# Patient Record
Sex: Male | Born: 1950 | Race: Black or African American | Hispanic: No | Marital: Single
Health system: Midwestern US, Community
[De-identification: ages and names within clinical notes are randomized; demographics above are authoritative.]

## PROBLEM LIST (undated history)

## (undated) DIAGNOSIS — D126 Benign neoplasm of colon, unspecified: Secondary | ICD-10-CM

## (undated) DIAGNOSIS — J90 Pleural effusion, not elsewhere classified: Secondary | ICD-10-CM

## (undated) DIAGNOSIS — H409 Unspecified glaucoma: Secondary | ICD-10-CM

## (undated) HISTORY — DX: Benign neoplasm of colon, unspecified: D12.6

## (undated) HISTORY — DX: Unspecified glaucoma: H40.9

## (undated) HISTORY — PX: CERVICAL FUSION: SHX112

---

## 2007-05-27 ENCOUNTER — Ambulatory Visit: Payer: Self-pay | Admitting: Family Medicine

## 2007-05-27 ENCOUNTER — Ambulatory Visit (HOSPITAL_COMMUNITY): Admission: RE | Admit: 2007-05-27 | Discharge: 2007-05-27 | Payer: Self-pay | Admitting: Family Medicine

## 2007-05-28 ENCOUNTER — Emergency Department (HOSPITAL_COMMUNITY): Admission: EM | Admit: 2007-05-28 | Discharge: 2007-05-28 | Payer: Self-pay | Admitting: Emergency Medicine

## 2007-06-01 ENCOUNTER — Inpatient Hospital Stay (HOSPITAL_COMMUNITY): Admission: RE | Admit: 2007-06-01 | Discharge: 2007-06-03 | Payer: Self-pay | Admitting: Neurosurgery

## 2007-09-15 HISTORY — PX: CERVICAL FUSION: SHX112

## 2007-10-18 ENCOUNTER — Encounter: Admission: RE | Admit: 2007-10-18 | Discharge: 2007-11-23 | Payer: Self-pay | Admitting: Neurosurgery

## 2008-01-06 ENCOUNTER — Encounter: Admission: RE | Admit: 2008-01-06 | Discharge: 2008-02-03 | Payer: Self-pay | Admitting: Neurosurgery

## 2008-03-14 ENCOUNTER — Encounter: Admission: RE | Admit: 2008-03-14 | Discharge: 2008-06-12 | Payer: Self-pay | Admitting: Neurosurgery

## 2008-04-24 ENCOUNTER — Ambulatory Visit: Payer: Self-pay | Admitting: Family Medicine

## 2008-05-03 ENCOUNTER — Ambulatory Visit: Payer: Self-pay | Admitting: Gastroenterology

## 2008-05-15 DIAGNOSIS — D126 Benign neoplasm of colon, unspecified: Secondary | ICD-10-CM

## 2008-05-15 HISTORY — DX: Benign neoplasm of colon, unspecified: D12.6

## 2008-05-17 ENCOUNTER — Ambulatory Visit: Payer: Self-pay | Admitting: Gastroenterology

## 2008-05-17 ENCOUNTER — Encounter: Payer: Self-pay | Admitting: Gastroenterology

## 2008-05-18 ENCOUNTER — Encounter: Payer: Self-pay | Admitting: Gastroenterology

## 2008-06-12 ENCOUNTER — Encounter: Admission: RE | Admit: 2008-06-12 | Discharge: 2008-06-12 | Payer: Self-pay | Admitting: Otolaryngology

## 2011-01-27 NOTE — Op Note (Signed)
NAME:  Phillip Ryan, Phillip Ryan NO.:  [id number]   MEDICAL RECORD NO.:  [id number]          PATIENT TYPE:  INP   LOCATION:  3031                         FACILITY:  [facility]   PHYSICIAN:  Hilda Lias, M.D.   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   PROCEDURE:  Anterior C3-4, 4-5, 5-6 decompression of spinal cord,  diskectomy, interbody fusion with allograft, plate, microscope.   SURGEON:  Hilda Lias, M.D.   ASSISTANT:  Coletta Memos, M.D.   CLINICAL HISTORY:  The patient was seen in my office few days ago  because sudden onset of weakness of the deltoid, biceps and wrist  extensors.  He had normal triceps.  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  Surgery was advised.  The  risks were explained to him and his wife.   PROCEDURE:  The patient was taken to the OR and after intubation, the  neck was cleaned with DuraPrep.  Transverse incision was then the left  side through the skin, subcutaneous tissue.  We went straight to the  cervical spine and x-rays showed that indeed we were at the level 4-5.  From then on, we removed the anterior osteophyte of 3-4, 4-5, 5-6.  We  brought the microscope into the area and we started at the level C3-C4  where the patient has step off of 2-3 mm.  The disk was quite  degenerative.  Diskectomy was accomplished.  The posterior ligament was  opened and decompression of both C4 nerve roots was achieved.  At the  level 4-5, we had the same finding with quite a bit of stenosis and  degenerative disk.  At the level 5-6 we found that indeed  there was two  pieces of herniated disk were completely adherent to the C6 nerve root.  Using the microdissection, we were able to decompress the C6 nerve root  with plenty of space.  At the end we drilled the endplates and three  piece of allograft of 7 mm were inserted followed by a plate using eight  screws.  Lateral cervical spine taken twice showed good position of the  plate and the screws.  From then on the area was irrigated.  Although we  accomplished good hemostasis nevertheless drain was left in the cervical  area.  The wound was closed with Vicryl and Steri-Strips.           ______________________________  Hilda Lias, M.D.     EB/MEDQ  D:  06/01/2007  T:  06/02/2007  Job:  [id number]

## 2011-06-25 LAB — CBC
RBC: 5.09
WBC: 6.5

## 2011-08-09 ENCOUNTER — Encounter: Payer: Self-pay | Admitting: *Deleted

## 2011-08-09 ENCOUNTER — Emergency Department (HOSPITAL_COMMUNITY): Payer: Medicare Other

## 2011-08-09 ENCOUNTER — Emergency Department (HOSPITAL_COMMUNITY)
Admission: EM | Admit: 2011-08-09 | Discharge: 2011-08-09 | Disposition: A | Discharge status: Home / Self Care | Payer: Medicare Other | Attending: Emergency Medicine | Admitting: Emergency Medicine

## 2011-08-09 DIAGNOSIS — Z981 Arthrodesis status: Secondary | ICD-10-CM | POA: Insufficient documentation

## 2011-08-09 DIAGNOSIS — S0181XA Laceration without foreign body of other part of head, initial encounter: Secondary | ICD-10-CM

## 2011-08-09 DIAGNOSIS — W1809XA Striking against other object with subsequent fall, initial encounter: Secondary | ICD-10-CM | POA: Insufficient documentation

## 2011-08-09 DIAGNOSIS — S0120XA Unspecified open wound of nose, initial encounter: Secondary | ICD-10-CM | POA: Insufficient documentation

## 2011-08-09 DIAGNOSIS — S0180XA Unspecified open wound of other part of head, initial encounter: Secondary | ICD-10-CM | POA: Insufficient documentation

## 2011-08-09 DIAGNOSIS — S060X9A Concussion with loss of consciousness of unspecified duration, initial encounter: Secondary | ICD-10-CM | POA: Insufficient documentation

## 2011-08-09 DIAGNOSIS — S069X9A Unspecified intracranial injury with loss of consciousness of unspecified duration, initial encounter: Secondary | ICD-10-CM

## 2011-08-09 DIAGNOSIS — M25569 Pain in unspecified knee: Secondary | ICD-10-CM | POA: Insufficient documentation

## 2011-08-09 DIAGNOSIS — M542 Cervicalgia: Secondary | ICD-10-CM | POA: Insufficient documentation

## 2011-08-09 DIAGNOSIS — W19XXXA Unspecified fall, initial encounter: Secondary | ICD-10-CM

## 2011-08-09 HISTORY — DX: Pleural effusion, not elsewhere classified: J90

## 2011-08-09 MED ORDER — DIAZEPAM 5 MG PO TABS: 5.0000 mg | ORAL_TABLET | Freq: Three times a day (TID) | ORAL | Status: DC | PRN

## 2011-08-09 MED ORDER — OXYCODONE-ACETAMINOPHEN 5-325 MG PO TABS
1.0000 | ORAL_TABLET | Freq: Once | ORAL | Status: AC
  Administered 2011-08-09: 1 via ORAL
  Filled 2011-08-09: qty 1

## 2011-08-09 MED ORDER — TETANUS-DIPHTHERIA TOXOIDS TD 5-2 LFU IM INJ
0.5000 mL | INJECTION | Freq: Once | INTRAMUSCULAR | Status: DC
  Filled 2011-08-09: qty 0.5

## 2011-08-09 MED ORDER — TETANUS-DIPHTH-ACELL PERTUSSIS 5-2.5-18.5 LF-MCG/0.5 IM SUSP
0.5000 mL | Freq: Once | INTRAMUSCULAR | Status: AC
  Administered 2011-08-09: 0.5 mL via INTRAMUSCULAR

## 2011-08-09 MED ORDER — OXYCODONE-ACETAMINOPHEN 5-325 MG PO TABS: 2.0000 | ORAL_TABLET | ORAL | Status: DC | PRN

## 2011-08-09 MED ORDER — IBUPROFEN 800 MG PO TABS
800.0000 mg | ORAL_TABLET | Freq: Once | ORAL | Status: AC
  Administered 2011-08-09: 800 mg via ORAL
  Filled 2011-08-09: qty 1

## 2011-08-09 MED ORDER — IBUPROFEN 800 MG PO TABS: 800.0000 mg | ORAL_TABLET | Freq: Three times a day (TID) | ORAL | Status: AC

## 2011-08-09 NOTE — ED Notes (Signed)
Pt states he lost consciousness while walking this evening.  C/o pain to left face and neck.  Glasses broke during fall.  Laceration to left cheek and forehead.  Bleeding controlled.

## 2011-08-09 NOTE — ED Provider Notes (Addendum)
History     CSN: [id number] Arrival date & time: 08/09/2011  2:33 AM   First MD Initiated Contact with Patient 08/09/11 0239      Chief Complaint  Patient presents with  . Loss of Consciousness    (Consider location/radiation/quality/duration/timing/severity/associated sxs/prior treatment) HPI 60 year old male presents to emergency department from home after fall. Patient reports he was walking down the hallway when his right knee gave out causing him to fall and hit his head. Patient reports he was briefly knocked unconscious for 10-15 seconds after the fall. Patient denies syncope and then fall. Patient reports he twisted his knee about a week ago and since then he has had problems with the knee. Patient complaining of posterior neck pain, and laceration to the face. Patient is unsure when his last tetanus booster was. Patient reports he has a history of chronic neck pain with prior cervical fusion. Patient reports current neck pain is similar to prior neck pain, just aggravated by the fall. Patient is back to baseline per family. Patient reports he had 2 beers and 2 mixed drinks tonight which is slightly more than he normally drinks.  Past Medical History  Diagnosis Date  . Pleural effusion     Past Surgical History  Procedure Date  . Cervical fusion     History reviewed. No pertinent family history.  History  Substance Use Topics  . Smoking status: Former Games developer  . Smokeless tobacco: Not on file  . Alcohol Use: Yes      Review of Systems  All other systems reviewed and are negative.    Allergies  Review of patient's allergies indicates no known allergies.  Home Medications  No current outpatient prescriptions on file.  BP 112/70  Pulse 90  Temp(Src) 97.8 F (36.6 C) (Oral)  Resp 16  SpO2 100%  Physical Exam  Nursing note and vitals reviewed. Constitutional: He is oriented to person, place, and time. He appears well-developed and well-nourished. No  distress.  HENT:  Head: Normocephalic.  Right Ear: External ear normal.  Left Ear: External ear normal.  Nose: Nose normal.  Mouth/Throat: Oropharynx is clear and moist.       Laceration to 4 head and left face corner of nose just above the lip  Eyes: Conjunctivae and EOM are normal. Pupils are equal, round, and reactive to light.  Neck: Normal range of motion. Neck supple. No JVD present. No tracheal deviation present. No thyromegaly present.       No step-off no crepitus mild diffuse tenderness to palpation of posterior neck  Cardiovascular: Normal rate, regular rhythm, normal heart sounds and intact distal pulses.  Exam reveals no gallop and no friction rub.   No murmur heard. Pulmonary/Chest: Effort normal and breath sounds normal. No stridor. No respiratory distress. He has no wheezes. He has no rales. He exhibits no tenderness.  Abdominal: Soft. Bowel sounds are normal. He exhibits no distension and no mass. There is no tenderness. There is no rebound and no guarding.  Musculoskeletal: Normal range of motion. He exhibits tenderness (mild diffuse tenderness over right knee mainly over patella. No joint line tenderness normal flexion extension, normal anterior posterior drawer testing). He exhibits no edema.       Healing abrasions over the right patella  Lymphadenopathy:    He has no cervical adenopathy.  Neurological: He is oriented to person, place, and time. He has normal reflexes. No cranial nerve deficit. He exhibits normal muscle tone. Coordination normal.  Skin: Skin is dry.  No rash noted. No erythema. No pallor.  Psychiatric: He has a normal mood and affect. His behavior is normal. Judgment and thought content normal.    ED Course  Procedures (including critical care time)  LACERATION REPAIR Performed by: Olivia Mackie Authorized by: Olivia Mackie Consent: Verbal consent obtained. Risks and benefits: risks, benefits and alternatives were discussed Consent given by:  patient Patient identity confirmed: provided demographic data Prepped and Draped in normal sterile fashion Wound explored  Laceration Location: left nasolabial fold  Laceration Length: 4 cm  No Foreign Bodies seen or palpated  Anesthesia: local infiltration  Local anesthetic: lidocaine 2% with epinephrine  Anesthetic total: 6 ml  Irrigation method: syringe Amount of cleaning: extensive  Skin closure:  Moderate complexity  Number of sutures: 2 deep vertical mattress of 6.0 vicryl, 9 interrupted of 5.0 prolene  Technique: vertical mattress, simple interrupted  Patient tolerance: Patient tolerated the procedure well with no immediate complications.  LACERATION REPAIR Performed by: Olivia Mackie Authorized by: Olivia Mackie Consent: Verbal consent obtained. Risks and benefits: risks, benefits and alternatives were discussed Consent given by: patient Patient identity confirmed: provided demographic data Prepped and Draped in normal sterile fashion Wound explored  Laceration Location: forehead center  Laceration Length: 1 cm  No Foreign Bodies seen or palpated  Anesthesia: none  Irrigation method: syringe Amount of cleaning: standard  Skin closure: steristrips  Number of sutures: 3    Patient tolerance: Patient tolerated the procedure well with no immediate complications.  Labs Reviewed - No data to display Dg Knee 2 Views Right  08/09/2011  *RADIOLOGY REPORT*  Clinical Data: Status post fall 1 week ago; right patellar pain. Knee gives out.  RIGHT KNEE - 1-2 VIEW  Comparison: None.  Findings: There is no evidence of fracture or dislocation.  The joint spaces are preserved.  No significant degenerative change is seen; the patellofemoral joint is grossly unremarkable in appearance.  No significant joint effusion is seen.  Prepatellar soft tissue swelling is noted.  IMPRESSION:  1.  No evidence of fracture or dislocation.  Given the history, MRI could be considered  for further evaluation, though there is no evidence of a significant joint effusion to suggest internal derangement. 2.  Prepatellar soft tissue swelling noted.  Original Report Authenticated By: Tonia Ghent, M.D.   Ct Head Wo Contrast  08/09/2011  *RADIOLOGY REPORT*  Clinical Data:  Loss of consciousness while walking; status post fall, with pain at the left face and neck.  Lacerations to the left cheek and forehead.  CT HEAD WITHOUT CONTRAST CT MAXILLOFACIAL WITHOUT CONTRAST CT CERVICAL SPINE WITHOUT CONTRAST  Technique:  Multidetector CT imaging of the head, cervical spine, and maxillofacial structures were performed using the standard protocol without intravenous contrast. Multiplanar CT image reconstructions of the cervical spine and maxillofacial structures were also generated.  Comparison:  MRI of the cervical spine performed 05/28/2007, and cervical spine radiographs performed 06/01/2007  CT HEAD  Findings: There is no evidence of acute infarction, mass lesion, or intra- or extra-axial hemorrhage on CT.  The posterior fossa, including the cerebellum, brainstem and fourth ventricle, is within normal limits.  The third and lateral ventricles, and basal ganglia are unremarkable in appearance.  The cerebral hemispheres are symmetric in appearance, with normal gray- white differentiation.  No mass effect or midline shift is seen.  There is no evidence of fracture; visualized osseous structures are unremarkable in appearance.  The visualized portions of the orbits are within normal limits.  The paranasal  sinuses and mastoid air cells are well-aerated.  A soft tissue laceration is noted overlying the left frontal calvarium, with scattered soft tissue air.  IMPRESSION:  1.  No evidence of traumatic intracranial injury or fracture. 2.  Soft tissue laceration overlying the left frontal calvarium, with scattered soft tissue air.  CT MAXILLOFACIAL  Findings:  There is no evidence of fracture or dislocation.  The  maxilla and mandible appear intact.  The nasal bone is unremarkable in appearance.  The visualized dentition demonstrates no acute abnormality.  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  The orbits are intact bilaterally.  There is partial opacification of the maxillary sinuses, more prominent on the right.  The remaining paranasal sinuses and mastoid air cells are well-aerated.  A relatively deep soft tissue laceration is noted overlying the left maxilla, with underlying soft tissue air.  There is also a soft tissue laceration overlying the left frontal calvarium, with soft tissue air.  The parapharyngeal fat planes are preserved.  The nasopharynx, oropharynx and hypopharynx are unremarkable in appearance.  The visualized portions of the valleculae and piriform sinuses are grossly unremarkable.  The parotid and submandibular glands are within normal limits.  No cervical lymphadenopathy is seen.  IMPRESSION:  1.  No evidence of fracture or dislocation. 2.  Relatively deep soft tissue laceration overlying the left maxilla, with underlying soft tissue air. 3.  Soft tissue laceration overlying the left frontal calvarium, with soft tissue air. 4.  Partial opacification of the maxillary sinuses, more prominent on the right. 5.  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  CT CERVICAL SPINE  Findings:   There is no evidence of fracture or subluxation. Vertebral bodies demonstrate normal height and alignment. The patient is status post anterior cervical spinal fusion at C3-C6. Mild degenerative change is noted about the dens.  Prevertebral soft tissues are within normal limits.  The thyroid gland is unremarkable in appearance.  Blebs are noted at the right lung apex.  No significant soft tissue abnormalities are seen.  IMPRESSION:  1.  No evidence of fracture or subluxation along the cervical spine. 2.  Status post anterior cervical spinal fusion at C3-C6. 3.  Blebs noted at the  right lung apex.  Original Report Authenticated By: Tonia Ghent, M.D.   Ct Cervical Spine Wo Contrast  08/09/2011  *RADIOLOGY REPORT*  Clinical Data:  Loss of consciousness while walking; status post fall, with pain at the left face and neck.  Lacerations to the left cheek and forehead.  CT HEAD WITHOUT CONTRAST CT MAXILLOFACIAL WITHOUT CONTRAST CT CERVICAL SPINE WITHOUT CONTRAST  Technique:  Multidetector CT imaging of the head, cervical spine, and maxillofacial structures were performed using the standard protocol without intravenous contrast. Multiplanar CT image reconstructions of the cervical spine and maxillofacial structures were also generated.  Comparison:  MRI of the cervical spine performed 05/28/2007, and cervical spine radiographs performed 06/01/2007  CT HEAD  Findings: There is no evidence of acute infarction, mass lesion, or intra- or extra-axial hemorrhage on CT.  The posterior fossa, including the cerebellum, brainstem and fourth ventricle, is within normal limits.  The third and lateral ventricles, and basal ganglia are unremarkable in appearance.  The cerebral hemispheres are symmetric in appearance, with normal gray- white differentiation.  No mass effect or midline shift is seen.  There is no evidence of fracture; visualized osseous structures are unremarkable in appearance.  The visualized portions of the orbits are within normal limits.  The  paranasal sinuses and mastoid air cells are well-aerated.  A soft tissue laceration is noted overlying the left frontal calvarium, with scattered soft tissue air.  IMPRESSION:  1.  No evidence of traumatic intracranial injury or fracture. 2.  Soft tissue laceration overlying the left frontal calvarium, with scattered soft tissue air.  CT MAXILLOFACIAL  Findings:  There is no evidence of fracture or dislocation.  The maxilla and mandible appear intact.  The nasal bone is unremarkable in appearance.  The visualized dentition demonstrates no acute  abnormality.  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  The orbits are intact bilaterally.  There is partial opacification of the maxillary sinuses, more prominent on the right.  The remaining paranasal sinuses and mastoid air cells are well-aerated.  A relatively deep soft tissue laceration is noted overlying the left maxilla, with underlying soft tissue air.  There is also a soft tissue laceration overlying the left frontal calvarium, with soft tissue air.  The parapharyngeal fat planes are preserved.  The nasopharynx, oropharynx and hypopharynx are unremarkable in appearance.  The visualized portions of the valleculae and piriform sinuses are grossly unremarkable.  The parotid and submandibular glands are within normal limits.  No cervical lymphadenopathy is seen.  IMPRESSION:  1.  No evidence of fracture or dislocation. 2.  Relatively deep soft tissue laceration overlying the left maxilla, with underlying soft tissue air. 3.  Soft tissue laceration overlying the left frontal calvarium, with soft tissue air. 4.  Partial opacification of the maxillary sinuses, more prominent on the right. 5.  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  CT CERVICAL SPINE  Findings:   There is no evidence of fracture or subluxation. Vertebral bodies demonstrate normal height and alignment. The patient is status post anterior cervical spinal fusion at C3-C6. Mild degenerative change is noted about the dens.  Prevertebral soft tissues are within normal limits.  The thyroid gland is unremarkable in appearance.  Blebs are noted at the right lung apex.  No significant soft tissue abnormalities are seen.  IMPRESSION:  1.  No evidence of fracture or subluxation along the cervical spine. 2.  Status post anterior cervical spinal fusion at C3-C6. 3.  Blebs noted at the right lung apex.  Original Report Authenticated By: Tonia Ghent, M.D.   Ct Maxillofacial Wo Cm  08/09/2011  *RADIOLOGY  REPORT*  Clinical Data:  Loss of consciousness while walking; status post fall, with pain at the left face and neck.  Lacerations to the left cheek and forehead.  CT HEAD WITHOUT CONTRAST CT MAXILLOFACIAL WITHOUT CONTRAST CT CERVICAL SPINE WITHOUT CONTRAST  Technique:  Multidetector CT imaging of the head, cervical spine, and maxillofacial structures were performed using the standard protocol without intravenous contrast. Multiplanar CT image reconstructions of the cervical spine and maxillofacial structures were also generated.  Comparison:  MRI of the cervical spine performed 05/28/2007, and cervical spine radiographs performed 06/01/2007  CT HEAD  Findings: There is no evidence of acute infarction, mass lesion, or intra- or extra-axial hemorrhage on CT.  The posterior fossa, including the cerebellum, brainstem and fourth ventricle, is within normal limits.  The third and lateral ventricles, and basal ganglia are unremarkable in appearance.  The cerebral hemispheres are symmetric in appearance, with normal gray- white differentiation.  No mass effect or midline shift is seen.  There is no evidence of fracture; visualized osseous structures are unremarkable in appearance.  The visualized portions of the orbits are within normal limits.  The  paranasal sinuses and mastoid air cells are well-aerated.  A soft tissue laceration is noted overlying the left frontal calvarium, with scattered soft tissue air.  IMPRESSION:  1.  No evidence of traumatic intracranial injury or fracture. 2.  Soft tissue laceration overlying the left frontal calvarium, with scattered soft tissue air.  CT MAXILLOFACIAL  Findings:  There is no evidence of fracture or dislocation.  The maxilla and mandible appear intact.  The nasal bone is unremarkable in appearance.  The visualized dentition demonstrates no acute abnormality.  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  The orbits are intact bilaterally.  There is partial  opacification of the maxillary sinuses, more prominent on the right.  The remaining paranasal sinuses and mastoid air cells are well-aerated.  A relatively deep soft tissue laceration is noted overlying the left maxilla, with underlying soft tissue air.  There is also a soft tissue laceration overlying the left frontal calvarium, with soft tissue air.  The parapharyngeal fat planes are preserved.  The nasopharynx, oropharynx and hypopharynx are unremarkable in appearance.  The visualized portions of the valleculae and piriform sinuses are grossly unremarkable.  The parotid and submandibular glands are within normal limits.  No cervical lymphadenopathy is seen.  IMPRESSION:  1.  No evidence of fracture or dislocation. 2.  Relatively deep soft tissue laceration overlying the left maxilla, with underlying soft tissue air. 3.  Soft tissue laceration overlying the left frontal calvarium, with soft tissue air. 4.  Partial opacification of the maxillary sinuses, more prominent on the right. 5.  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  CT CERVICAL SPINE  Findings:   There is no evidence of fracture or subluxation. Vertebral bodies demonstrate normal height and alignment. The patient is status post anterior cervical spinal fusion at C3-C6. Mild degenerative change is noted about the dens.  Prevertebral soft tissues are within normal limits.  The thyroid gland is unremarkable in appearance.  Blebs are noted at the right lung apex.  No significant soft tissue abnormalities are seen.  IMPRESSION:  1.  No evidence of fracture or subluxation along the cervical spine. 2.  Status post anterior cervical spinal fusion at C3-C6. 3.  Blebs noted at the right lung apex.  Original Report Authenticated By: Tonia Ghent, M.D.        MDM  60 year old gentleman with fall and LOC who sustained some facial lacerations. We'll check head C-spine and max face CT scans for possible severe injury. Patient with normal  neuro exam appears slightly intoxicated. We'll update tetanus shot. Patient and family are adamant that he did not have an episode of syncope more that he had brief LOC after his fall        Olivia Mackie, MD 08/09/11 1610  Olivia Mackie, MD 08/09/11 [phone]

## 2011-08-14 ENCOUNTER — Encounter (HOSPITAL_COMMUNITY): Payer: Self-pay | Admitting: Emergency Medicine

## 2011-08-14 ENCOUNTER — Emergency Department (HOSPITAL_COMMUNITY)
Admission: EM | Admit: 2011-08-14 | Discharge: 2011-08-14 | Disposition: A | Discharge status: Home / Self Care | Payer: Medicare Other | Attending: Emergency Medicine | Admitting: Emergency Medicine

## 2011-08-14 DIAGNOSIS — Z4802 Encounter for removal of sutures: Secondary | ICD-10-CM

## 2011-08-14 NOTE — ED Provider Notes (Cosign Needed)
History     CSN: [id number] Arrival date & time: 08/14/2011 10:38 AM   First MD Initiated Contact with Patient 08/14/11 1105      Chief Complaint  Patient presents with  . Suture / Staple Removal    (Consider location/radiation/quality/duration/timing/severity/associated sxs/prior treatment) HPI Comments: Patient was seen 5-1/2 days previously and had sutures placed in the left side of his face. He denies any significant problems from it. No purulent drainage or fevers. He did get updated tetanus shot during that visit. Patient believes that 11 sutures were placed. In review of the patient's prior notes, he did have 2 deep sutures placed which are absorbable, and had 9 sutures placed superficially will need to be removed.  Patient is a 60 y.o. male presenting with suture removal. The history is provided by the patient.  Suture / Staple Removal  The sutures were placed 3 to 6 days ago. Treatments since wound repair include antibiotic ointment use.    Past Medical History  Diagnosis Date  . Pleural effusion     Past Surgical History  Procedure Date  . Cervical fusion     History reviewed. No pertinent family history.  History  Substance Use Topics  . Smoking status: Former Games developer  . Smokeless tobacco: Not on file  . Alcohol Use: Yes      Review of Systems  Constitutional: Negative.   HENT: Negative for neck pain and neck stiffness.   Skin: Positive for wound. Negative for color change and rash.  Neurological: Negative for headaches.    Allergies  Review of patient's allergies indicates no known allergies.  Home Medications   Current Outpatient Rx  Name Route Sig Dispense Refill  . DIAZEPAM 5 MG PO TABS Oral Take 5 mg by mouth every 8 (eight) hours as needed. For anxiety/muscle spasms     . IBUPROFEN 800 MG PO TABS Oral Take 1 tablet (800 mg total) by mouth 3 (three) times daily. 21 tablet 0  . OXYCODONE-ACETAMINOPHEN 5-325 MG PO TABS Oral Take 2 tablets by  mouth every 4 (four) hours as needed. For pain       BP 144/95  Pulse 96  Temp(Src) 98.7 F (37.1 C) (Oral)  Resp 16  SpO2 100%  Physical Exam  Nursing note and vitals reviewed. Constitutional: He appears well-developed and well-nourished.  HENT:  Head: Normocephalic.    Eyes: Pupils are equal, round, and reactive to light.  Neck: Normal range of motion. Neck supple.    ED Course  Procedures (including critical care time)  Labs Reviewed - No data to display No results found.   No diagnosis found.    MDM    We will remove the 9 sutures. Skin appears to be healing well. Patient can follow up routinely with his primary care physician as needed. I did review the prior emergency department note to determine exact number sutures to be removed.        Gavin Pound. Zaven Klemens, MD 08/14/11 1151

## 2011-08-14 NOTE — Discharge Instructions (Signed)
Scar Minimization You will have a scar anytime you have surgery and a cut is made in the skin or you have something removed from your skin (mole, skin cancer, cyst). Although scars are unavoidable following surgery, there are ways to minimize their appearance. It is important to follow all the instructions you receive from your caregiver about wound care. How your wound heals will influence the appearance of your scar. If you do not follow the wound care instructions as directed, complications such as infection may occur. Wound instructions include keeping the wound clean, moist, and not letting the wound form a scab. Some people form scars that are raised and lumpy (hypertrophic) or larger than the initial wound (keloidal). HOME CARE INSTRUCTIONS   Follow wound care instructions as directed.   Keep the wound clean by washing it with soap and water.   Keep the wound moist with provided antibiotic cream or petroleum jelly until completely healed. Moisten twice a day for about 2 weeks.   Get stitches (sutures) taken out at the scheduled time.   Avoid touching or manipulating your wound unless needed. Wash your hands thoroughly before and after touching your wound.   Follow all restrictions such as limits on exercise or work. This depends on where your scar is located.   Keep the scar protected from sunburn. Cover the scar with sunscreen/sunblock with SPF 30 or higher.   Gently massage the scar using a circular motion to help minimize the appearance of the scar. Do this only after the wound has closed and all the sutures have been removed.   For hypertrophic or keloidal scars, there are several ways to treat and minimize their appearance. Methods include compression therapy, intralesional corticosteroids, laser therapy, or surgery. These methods are performed by your caregiver.  Remember that the scar may appear lighter or darker than your normal skin color. This difference in color should even  out with time. SEEK MEDICAL CARE IF:   You have an oral temperature above 102 F (38.9 C).   You develop signs of infection such as pain, redness, pus, and warmth.   You have questions or concerns.  Document Released: 02/18/2010 Document Revised: 04/29/2011 Document Reviewed: 02/18/2010 ExitCare Patient Information 2012 [employer]. 

## 2011-08-14 NOTE — ED Notes (Signed)
Pt here for suture removal from face from laceration from glasses on Sunday

## 2011-11-30 ENCOUNTER — Encounter: Payer: Self-pay | Admitting: Gastroenterology

## 2012-06-07 ENCOUNTER — Encounter: Payer: Self-pay | Admitting: Gastroenterology

## 2012-07-06 ENCOUNTER — Encounter: Payer: Self-pay | Admitting: Gastroenterology

## 2013-10-30 LAB — HEPATITIS PANEL, ACUTE
Hep A IgM: NONREACTIVE
Hep B Core Ab, IgM: NONREACTIVE
Hepatitis B Surface Ag: NONREACTIVE
Hepatitis C Ab: REACTIVE — AB

## 2013-10-30 LAB — CBC WITH AUTO DIFFERENTIAL
Absolute Eos #: 0.3 10*3/uL (ref 0.0–0.4)
Absolute Lymph #: 2.5 10*3/uL (ref 1.0–4.8)
Absolute Mono #: 0.9 10*3/uL (ref 0.1–1.2)
Basophils Absolute: 0 10*3/uL (ref 0.0–0.2)
Basophils: 1 % (ref 0–2)
Eosinophils %: 4 % (ref 1–4)
Hematocrit: 41.8 % (ref 41–53)
Hemoglobin: 14.4 g/dL (ref 13.5–17.5)
Lymphocytes: 36 % (ref 24–44)
MCH: 32 pg (ref 26–34)
MCHC: 34.3 g/dL (ref 31–37)
MCV: 93.2 fL (ref 80–100)
MPV: 8.4 fL (ref 6.0–12.0)
Monocytes: 13 % — ABNORMAL HIGH (ref 2–11)
Platelets: 251 10*3/uL (ref 140–450)
RBC: 4.49 m/uL — ABNORMAL LOW (ref 4.5–5.9)
RDW: 14.6 % (ref 12.5–15.4)
Seg Neutrophils: 46 % (ref 36–66)
Segs Absolute: 3.3 10*3/uL (ref 1.8–7.7)
WBC: 6.9 10*3/uL (ref 3.5–11.0)

## 2013-10-30 LAB — LIPID PANEL
Chol/HDL Ratio: 2 (ref <5)
Cholesterol: 140 mg/dL (ref <200)
HDL: 70 mg/dL (ref >40)
LDL Cholesterol: 57 mg/dL (ref 0–130)
Triglycerides: 64 mg/dL (ref <150)

## 2013-10-30 LAB — COMPREHENSIVE METABOLIC PANEL
ALT: 25 U/L (ref 5–41)
AST: 24 U/L (ref <40)
Albumin/Globulin Ratio: 1.1 (ref 1.0–2.5)
Albumin: 3.9 g/dL (ref 3.5–5.2)
Alkaline Phosphatase: 50 U/L (ref 40–129)
Anion Gap: 16 mmol/L (ref 8–16)
BUN: 18 mg/dL (ref 8–23)
CO2: 26 mmol/L (ref 20–31)
Calcium: 8.6 mg/dL (ref 8.6–10.4)
Chloride: 102 mmol/L (ref 98–107)
Creatinine: 0.91 mg/dL (ref 0.70–1.20)
GFR African American: >60 mL/min (ref >60)
GFR Non-African American: >60 mL/min (ref >60)
Glucose: 92 mg/dL (ref 70–99)
Potassium: 3.9 mmol/L (ref 3.7–5.3)
Sodium: 140 mmol/L (ref 135–144)
Total Bilirubin: 1.22 mg/dL — ABNORMAL HIGH (ref 0.3–1.2)
Total Protein: 7.5 g/dL (ref 6.4–8.3)

## 2013-10-30 LAB — TSH WITH REFLEX: TSH: 1.1 mIU/L (ref 0.30–5.00)

## 2013-10-31 LAB — EKG 12-LEAD
Atrial Rate: 67 {beats}/min
P Axis: 66 degrees
P-R Interval: 184 ms
Q-T Interval: 410 ms
QRS Duration: 114 ms
QTc Calculation (Bazett): 433 ms
R Axis: -55 degrees
T Axis: 37 degrees
Ventricular Rate: 67 {beats}/min

## 2014-01-27 ENCOUNTER — Encounter (HOSPITAL_COMMUNITY): Payer: Self-pay | Admitting: Emergency Medicine

## 2014-01-27 ENCOUNTER — Emergency Department (HOSPITAL_COMMUNITY)
Admission: EM | Admit: 2014-01-27 | Discharge: 2014-01-28 | Disposition: A | Discharge status: Home / Self Care | Payer: Medicare Other | Attending: Emergency Medicine | Admitting: Emergency Medicine

## 2014-01-27 DIAGNOSIS — Z87891 Personal history of nicotine dependence: Secondary | ICD-10-CM | POA: Insufficient documentation

## 2014-01-27 DIAGNOSIS — Z8709 Personal history of other diseases of the respiratory system: Secondary | ICD-10-CM | POA: Insufficient documentation

## 2014-01-27 DIAGNOSIS — R252 Cramp and spasm: Secondary | ICD-10-CM | POA: Insufficient documentation

## 2014-01-27 LAB — URINALYSIS, ROUTINE W REFLEX MICROSCOPIC
Bilirubin Urine: NEGATIVE
Glucose, UA: NEGATIVE mg/dL
HGB URINE DIPSTICK: NEGATIVE
Ketones, ur: NEGATIVE mg/dL
Leukocytes, UA: NEGATIVE
NITRITE: NEGATIVE
PROTEIN: NEGATIVE mg/dL
SPECIFIC GRAVITY, URINE: 1.022 (ref 1.005–1.030)
UROBILINOGEN UA: 1 mg/dL (ref 0.0–1.0)
pH: 5 (ref 5.0–8.0)

## 2014-01-27 LAB — I-STAT CHEM 8, ED
BUN: 14 mg/dL (ref 6–23)
CALCIUM ION: 1.18 mmol/L (ref 1.13–1.30)
Chloride: 106 mEq/L (ref 96–112)
Creatinine, Ser: 1.3 mg/dL (ref 0.50–1.35)
GLUCOSE: 105 mg/dL — AB (ref 70–99)
HEMATOCRIT: 41 % (ref 39.0–52.0)
HEMOGLOBIN: 13.9 g/dL (ref 13.0–17.0)
Potassium: 4 mEq/L (ref 3.7–5.3)
Sodium: 143 mEq/L (ref 137–147)
TCO2: 24 mmol/L (ref 0–100)

## 2014-01-27 LAB — CBC
HEMATOCRIT: 39.4 % (ref 39.0–52.0)
Hemoglobin: 13.1 g/dL (ref 13.0–17.0)
MCH: 27.9 pg (ref 26.0–34.0)
MCHC: 33.2 g/dL (ref 30.0–36.0)
MCV: 83.8 fL (ref 78.0–100.0)
PLATELETS: 152 10*3/uL (ref 150–400)
RBC: 4.7 MIL/uL (ref 4.22–5.81)
RDW: 13.3 % (ref 11.5–15.5)
WBC: 7.7 10*3/uL (ref 4.0–10.5)

## 2014-01-27 MED ORDER — SODIUM CHLORIDE 0.9 % IV SOLN
INTRAVENOUS | Status: DC
  Administered 2014-01-27: 125 mL/h via INTRAVENOUS

## 2014-01-27 NOTE — ED Notes (Signed)
Pt states his cramping is better.

## 2014-01-27 NOTE — ED Provider Notes (Signed)
CSN: [id number]     Arrival date & time 01/27/14  2136 History   First MD Initiated Contact with Patient 01/27/14 2307     Chief Complaint  Patient presents with  . arm, leg cramping      (Consider location/radiation/quality/duration/timing/severity/associated sxs/prior Treatment) HPI History provided by patient. At a picnic all day long with family. Around 8 PM began having cramping in his arms and legs. Patient reports that both arms were drawn up. EMS was called. Patient was given IV fluids in route. No emergency room, symptoms have resolved. He feels much better with like to be discharged home. He reports that occasionally he gets the symptoms. They last a short while and go away on their own. Today the patient complains of feeling dehydrated, states he's not been drinking a lot of water like he should. He denies any other related symptoms. No chest pain, abdominal pain, shortness of breath, nausea, vomiting or diarrhea. Family bedside concerned about history of cervical fusion 2008. Patient denies any neck pain or new symptoms related to his neck surgery. He does not take any medications. He denies any recent illness.   Past Medical History  Diagnosis Date  . Pleural effusion    Past Surgical History  Procedure Laterality Date  . Cervical fusion     No family history on file. History  Substance Use Topics  . Smoking status: Former Research scientist (life sciences)  . Smokeless tobacco: Not on file  . Alcohol Use: Yes    Review of Systems  Constitutional: Negative for fever and chills.  Respiratory: Negative for shortness of breath.   Cardiovascular: Negative for chest pain.  Gastrointestinal: Negative for vomiting, abdominal pain and diarrhea.  Genitourinary: Negative for difficulty urinating.  Musculoskeletal: Negative for back pain and neck pain.  Skin: Negative for rash.  Neurological: Negative for weakness, numbness and headaches.  All other systems reviewed and are negative.     Allergies   Review of patient's allergies indicates no known allergies.  Home Medications   Prior to Admission medications   Not on File   BP 133/78  Pulse 83  Temp(Src) 97.2 F (36.2 C) (Oral)  Resp 18  Ht 5\' 8"  (1.727 m)  Wt 167 lb (75.751 kg)  BMI 25.40 kg/m2  SpO2 100% Physical Exam  Constitutional: He is oriented to person, place, and time. He appears well-developed and well-nourished.  HENT:  Head: Normocephalic and atraumatic.  Dry mucous membranes  Eyes: EOM are normal. Pupils are equal, round, and reactive to light.  Neck: Neck supple.  Cardiovascular: Normal rate, regular rhythm and intact distal pulses.   Pulmonary/Chest: Effort normal and breath sounds normal. No respiratory distress.  Abdominal: Soft. He exhibits no distension. There is no tenderness.  Musculoskeletal: Normal range of motion. He exhibits no edema.  Neurological: He is alert and oriented to person, place, and time. No cranial nerve deficit. Coordination normal.  Speech clear, good range of motion throughout x4  Skin: Skin is warm and dry.    ED Course  Procedures (including critical care time) Labs Review Labs Reviewed  URINALYSIS, ROUTINE W REFLEX MICROSCOPIC - Abnormal; Notable for the following:    APPearance HAZY (*)    All other components within normal limits  I-STAT CHEM 8, ED - Abnormal; Notable for the following:    Glucose, Bld 105 (*)    All other components within normal limits  CBC    Imaging Review No results found.  IV fluids. Oral fluids.  12:54 AM has  been up ambulating, making urine, continues to be asymptomatic in the ER, and is requesting to discharge.  Family bedside comfortable with plan discharge home, followup with primary care physician, and also followup with neurosurgeon Dr. Joya Salm. Patient agrees to return precautions.   MDM   Dx: Muscle cramps  Intermittent symptoms, resolved PTA, clinically dry mm and PT feels dehydrated. Treated with PO and IVfs. Ua nd labs  reviewed, Symptoms improved. VS and nurses notes reviewed and considered. Normotensive at time of discharge.    Teressa Lower, MD 01/28/14 [phone]

## 2014-01-27 NOTE — ED Notes (Signed)
Ambulated pt. Pt tolerated well. No dizziness, no cramping.

## 2014-01-27 NOTE — ED Notes (Signed)
[place] [facility] [place] [facility] [place]. Did not drink a lot of fluids. When pt stood up he experienced cramping in arms and legs. 18 LAC. 500cc NS in. Hx neck fusion. 180/98 CBG 101

## 2014-01-28 NOTE — Discharge Instructions (Signed)
°  Muscle Cramps and Spasms °Muscle cramps and spasms occur when a muscle or muscles tighten and you have no control over this tightening (involuntary muscle contraction). They are a common problem and can develop in any muscle. The most common place is in the calf muscles of the leg. Both muscle cramps and muscle spasms are involuntary muscle contractions, but they also have differences:  °· Muscle cramps are sporadic and painful. They may last a few seconds to a quarter of an hour. Muscle cramps are often more forceful and last longer than muscle spasms. °· Muscle spasms may or may not be painful. They may also last just a few seconds or much longer. °CAUSES  °It is uncommon for cramps or spasms to be due to a serious underlying problem. In many cases, the cause of cramps or spasms is unknown. Some common causes are:  °· Overexertion.   °· Overuse from repetitive motions (doing the same thing over and over).   °· Remaining in a certain position for a long period of time.   °· Improper preparation, form, or technique while performing a sport or activity.   °· Dehydration.   °· Injury.   °· Side effects of some medicines.   °· Abnormally low levels of the salts and ions in your blood (electrolytes), especially potassium and calcium. This could happen if you are taking water pills (diuretics) or you are pregnant.   °Some underlying medical problems can make it more likely to develop cramps or spasms. These include, but are not limited to:  °· Diabetes.   °· Parkinson disease.   °· Hormone disorders, such as thyroid problems.   °· Alcohol abuse.   °· Diseases specific to muscles, joints, and bones.   °· Blood vessel disease where not enough blood is getting to the muscles.   °HOME CARE INSTRUCTIONS  °· Stay well hydrated. Drink enough water and fluids to keep your urine clear or pale yellow. °· It may be helpful to massage, stretch, and relax the affected muscle. °· For tight or tense muscles, use a warm towel, heating  pad, or hot shower water directed to the affected area. °· If you are sore or have pain after a cramp or spasm, applying ice to the affected area may relieve discomfort. °· Put ice in a plastic bag. °· Place a towel between your skin and the bag. °· Leave the ice on for 15-20 minutes, 03-04 times a day. °· Medicines used to treat a known cause of cramps or spasms may help reduce their frequency or severity. Only take over-the-counter or prescription medicines as directed by your caregiver. °SEEK MEDICAL CARE IF:  °Your cramps or spasms get more severe, more frequent, or do not improve over time.  °MAKE SURE YOU:  °· Understand these instructions. °· Will watch your condition. °· Will get help right away if you are not doing well or get worse. °Document Released: 02/20/2002 Document Revised: 12/26/2012 Document Reviewed: 08/17/2012 °ExitCare® Patient Information ©2014 [employer]. ° ° °

## 2014-06-18 ENCOUNTER — Encounter: Admit: 2014-06-18 | Primary: Internal Medicine

## 2014-06-18 ENCOUNTER — Inpatient Hospital Stay: Admit: 2014-06-18 | Discharge: 2014-06-18 | Disposition: A | Discharge status: Home / Self Care

## 2014-06-18 ENCOUNTER — Encounter: Admit: 2014-06-18 | Discharge: 2014-06-26 | Payer: PRIVATE HEALTH INSURANCE | Primary: Internal Medicine

## 2014-06-18 MED ORDER — ACETAMINOPHEN-CODEINE 300-30 MG PO TABS
300-30 MG | ORAL_TABLET | ORAL | Status: AC | PRN
Start: 2014-06-18 — End: ?

## 2014-06-18 MED ORDER — METHOCARBAMOL 750 MG PO TABS
750 MG | ORAL_TABLET | Freq: Three times a day (TID) | ORAL | Status: AC
Start: 2014-06-18 — End: 2014-06-28

## 2014-06-18 NOTE — Discharge Instructions (Signed)
Hand Sprain: Care Instructions  Your Care Instructions  A hand sprain occurs when you stretch or tear a ligament in your hand. Ligaments are the tough tissues that connect one bone to another.  Most hand sprains will heal with treatment you can do at home.  Follow-up care is a key part of your treatment and safety. Be sure to make and go to all appointments, and call your doctor if you are having problems. It's also a good idea to know your test results and keep a list of the medicines you take.  How can you care for yourself at home?   If your doctor gave you a splint or immobilizer, wear it as directed. This will help keep swelling down and help your hand heal.   Follow your doctor's directions for exercise and other activity.   For the first 2 days after your injury, avoid things that might increase swelling, such as hot showers, hot tubs, or hot packs.   Put ice or a cold pack on your hand for 10 to 20 minutes at a time to stop swelling. Try this every 1 to 2 hours for 3 days (when you are awake) or until the swelling goes down. Put a thin cloth between the ice pack and your skin. Keep your splint dry.   After 2 or 3 days, if your swelling is gone, put a heating pad (set on low) or a warm cloth on your hand. Some experts suggest that you go back and forth between hot and cold treatments.   Prop up your hand on a pillow when you ice it or anytime you sit or lie down. Try to keep it above the level of your heart. This will help reduce swelling.   Take pain medicines exactly as directed.   If the doctor gave you a prescription medicine for pain, take it as prescribed.   If you are not taking a prescription pain medicine, ask your doctor if you can take an over-the-counter medicine.   Return to your usual level of activity slowly.  When should you call for help?  Call your doctor now or seek immediate medical care if:   Your pain is worse.   You have new or increased swelling in your hand.   You  cannot move your hand.   You have tingling, weakness, or numbness in your hand or fingers.   Your hand or fingers are cool or pale or change color.   You have a fever.   Your hand or fingers are red.  Watch closely for changes in your health, and be sure to contact your doctor if:   Your hand does not get better as expected.   Where can you learn more?   Go to [email or web address] and sign in to your MyChart account. Enter T831 in the Search Health Information box to learn more about "Hand Sprain: Care Instructions."    If you do not have an account, please click on the "Sign Up Now" link.      2006-2015 Healthwise, Inc[employer]. Care instructions adapted under license by [facility]. This care instruction is for use with your licensed healthcare professional. If you have questions about a medical condition or this instruction, always ask your healthcare professional. Healthwise, Incorporated disclaims any warranty or liability for your use of this information.  Content Version: 10.6.465758; Current as of: Feb 02, 2014

## 2014-06-18 NOTE — Other (Signed)
Pt has had pain in lt leg with noticeable note below the knee. Pt states after walking a great distance his leg will go numb and the numbness travels from one leg to the other. This started over a year ago. Pt also has stiffness in his back.

## 2014-06-19 NOTE — ED Provider Notes (Signed)
[facility] EMERGENCY DEPARTMENT  eMERGENCY dEPARTMENT eNCOUnter      Pt Name: Martin Page  MRN: [id number]  Birthdate May 19, 1951  Date of evaluation: 06/18/2014  Provider: Capone Schwinn Gerarda Fraction NP, CNP    CHIEF COMPLAINT       Chief Complaint   Patient presents with   ??? Back Pain     lower back, onset 1 year ago   ??? Knee Pain     left         HISTORY OF PRESENT ILLNESS  (Location/Symptom, Timing/Onset, Context/Setting, Quality, Duration, Modifying Factors, Severity.)   Martin Page is a 63 y.o. male who presents to the emergency department today by private vehicle complaints of low back pain.  Patient states that he's had this low back pain for the last 1 year but is Progressively worse.  He states that his leg will go numb these locking for great distances.  He also complaints of pain to left knee. He rates the pain a 10  On 0-10 scale. He denies fever or chills      Nursing Notes were reviewed.    ALLERGIES     Motrin and Pcn    CURRENT MEDICATIONS       Discharge Medication List as of 06/18/2014 12:15 PM      CONTINUE these medications which have NOT CHANGED    Details   hydrochlorothiazide (HYDRODIURIL) 25 MG tablet Take 25 mg by mouth daily             PAST MEDICAL HISTORY         Diagnosis Date   ??? Hypertension        SURGICAL HISTORY     History reviewed. No pertinent past surgical history.      FAMILY HISTORY     History reviewed. No pertinent family history.  No family status information on file.        SOCIAL HISTORY      reports that he has been smoking Cigars.  He does not have any smokeless tobacco history on file. He reports that he drinks alcohol. He reports that he does not use illicit drugs.    REVIEW OF SYSTEMS    (2-9 systems for level 4, 10 or more for level 5)     Review of Systems   Constitutional: Negative for fever, chills and unexpected weight change.   HENT: Negative for congestion, rhinorrhea, sinus pressure and sore throat.    Respiratory: Negative for cough, shortness of breath and  wheezing.    Cardiovascular: Negative for chest pain and palpitations.   Gastrointestinal: Negative for nausea, vomiting, abdominal pain, diarrhea and constipation.   Genitourinary: Negative for dysuria and hematuria.   Musculoskeletal: Positive for back pain. Negative for myalgias and arthralgias.   Skin: Negative for color change and rash.   Neurological: Negative for dizziness, weakness and headaches.   Hematological: Negative for adenopathy.        Except as noted above the remainder of the review of systems was reviewed and negative.     PHYSICAL EXAM    (up to 7 for level 4, 8 or more for level 5)   ED Triage Vitals   BP Temp Temp Source Pulse Resp SpO2 Height Weight   06/18/14 1104 06/18/14 1104 06/18/14 1104 06/18/14 1104 06/18/14 1104 06/18/14 1104 06/18/14 1104 06/18/14 1104   141/96 mmHg 97.9 ??F (36.6 ??C) Oral 74 16 100 % 6' (1.829 m) 199 lb (90.266 kg)  Physical Exam   Constitutional: He is oriented to person, place, and time. He appears well-developed and well-nourished.   HENT:   Head: Normocephalic and atraumatic.   Mouth/Throat: Oropharynx is clear and moist.   Eyes: Conjunctivae are normal. Pupils are equal, round, and reactive to light.   Neck: Normal range of motion. Neck supple.   Cardiovascular: Normal rate and regular rhythm.    Pulmonary/Chest: Effort normal and breath sounds normal. No stridor. No respiratory distress.   Abdominal: Soft. Bowel sounds are normal.   Musculoskeletal: Normal range of motion.        Left wrist: He exhibits tenderness.        Left knee: Tenderness found.        Lumbar back: He exhibits tenderness and bony tenderness.   Lymphadenopathy:     He has no cervical adenopathy.   Neurological: He is alert and oriented to person, place, and time.   Skin: Skin is warm and dry. No rash noted.   Psychiatric: He has a normal mood and affect.           DIAGNOSTIC RESULTS       RADIOLOGY:   Non-plain film images such as CT, Ultrasound and MRI are read by the radiologist.  Plain radiographic images are visualized and preliminarily interpreted by the emergency physician with the below findings:    Xr Wrist Left Standard    06/18/2014   XR WRIST LEFT STANDARD  INDICATION: Larey SeatFell. Left wrist pain.  COMPARISON:  No priors available.  TECHNIQUE/FINDINGS: AP, lateral, oblique and scaphoid views obtained.  No acute fracture. There is radiocarpal joint space narrowing. There is an ossicle adjacent to the ulnar styloid. No dislocation. Soft tissues grossly intact.     06/18/2014   IMPRESSION:  NO ACUTE FRACTURE. SOME RADIOCARPAL JOINT SPACE NARROWING MAY REFLECT DEGENERATIVE CHANGE.  Report electronically signed by Stephania Fragminaniel Donkor, M.D. on 06/18/2014 11:54 AM 06/18/2014 11:54 AM    Xr Knee Left Standard    06/18/2014   XR KNEE LEFT STANDARD  INDICATION: Larey SeatFell 2 days ago. Left knee Pain.  COMPARISON:  No priors.  TECHNIQUE/FINDINGS: AP, lateral and oblique views obtained.   No fracture or dislocation. No lytic or blastic lesion. Joint spaces preserved.     06/18/2014   IMPRESSION:  NO ACUTE OSSEOUS ABNORMALITY.  Report electronically signed by Stephania Fragminaniel Donkor, M.D. on 06/18/2014 11:53 AM 06/18/2014 11:53 AM    Xr Lumbar Ap Lat L5s1 [place]    06/18/2014    Lumbosacral spine:  06/18/2014.  Reason for study: Fall 2 days ago, lower back pain.  Comparison: None.  Technique: AP, lateral, and cone-down lateral views of the lumbosacral spine were obtained.  Findings: Five non rib-bearing vertebrae in the lumbar spine. No evidence of acute bony abnormality. Mild L4-L5 and L5-S1 disc space narrowing, anterior bony spurring and facet hypertrophy. Sacroiliac joints are unremarkable. Mineralization is within  normal limits.      Impression: No radiographic evidence of acute process.  Report electronically signed by Garlan FillersSalmaan Noor, M.D. on 06/18/2014 11:47 AM 06/18/2014 11:47 AM    Interpretation per the Radiologist below, if available at the time of this note:    XR WRIST LEFT STANDARD    Final Result: IMPRESSION:      NO  ACUTE FRACTURE. SOME RADIOCARPAL JOINT SPACE NARROWING MAY REFLECT DEGENERATIVE CHANGE.        Report electronically signed by Stephania Fragminaniel Donkor, M.D. on 06/18/2014 11:54 AM 06/18/2014 11:54 AM   XR LUMBAR AP LAT  L5S1 [place]    Final Result:        XR KNEE LEFT STANDARD    Final Result: IMPRESSION:      NO ACUTE OSSEOUS ABNORMALITY.        Report electronically signed by Stephania Fragmin, M.D. on 06/18/2014 11:53 AM 06/18/2014 11:53 AM           LABS:  Labs Reviewed - No data to display    All other labs were within normal range or not returned as of this dictation.    EMERGENCY DEPARTMENT COURSE and DIFFERENTIAL DIAGNOSIS/MDM:   Vitals:    Filed Vitals:    06/18/14 1104   BP: 141/96   Pulse: 74   Temp: 97.9 ??F (36.6 ??C)   TempSrc: Oral   Resp: 16   Height: 6' (1.829 m)   Weight: 199 lb (90.266 kg)   SpO2: 100%       Medical Decision Making: patient will be placed on tylenol with codeine and robaxin      FINAL IMPRESSION      1. Sprain of wrist, left, initial encounter    2. Knee sprain, left, initial encounter    3. Low back pain with sciatica, sciatica laterality unspecified, unspecified back pain laterality          DISPOSITION/PLAN   DISPOSITION Decision to Discharge    PATIENT REFERRED TO:   Raleigh Nation, MD  [address]  [place] [address]  [place] [address]  [phone]            DISCHARGE MEDICATIONS:     Discharge Medication List as of 06/18/2014 12:15 PM      START taking these medications    Details   methocarbamol (ROBAXIN-750) 750 MG tablet Take 1 tablet by mouth 3 times daily for 10 days, Disp-30 tablet, R-0      acetaminophen-codeine (TYLENOL/CODEINE #3) 300-30 MG per tablet Take 1 tablet by mouth every 4 hours as needed for Pain, Disp-30 tablet, R-0                 (Please note that portions of this note were completed with a voice recognition program.  Efforts were made to edit the dictations but occasionally words are mis-transcribed.)    Elton Heid Gerarda Fraction NP, CNP  Certified Nurse  Practitioner          Cecille Rubin, CNP  06/19/14 1040

## 2014-11-21 ENCOUNTER — Ambulatory Visit (INDEPENDENT_AMBULATORY_CARE_PROVIDER_SITE_OTHER): Payer: PPO | Admitting: Family Medicine

## 2014-11-21 ENCOUNTER — Encounter: Payer: Self-pay | Admitting: Family Medicine

## 2014-11-21 VITALS — BP 120/76 | HR 71 | Ht 67.0 in | Wt 155.0 lb

## 2014-11-21 DIAGNOSIS — Z23 Encounter for immunization: Secondary | ICD-10-CM | POA: Diagnosis not present

## 2014-11-21 DIAGNOSIS — Z86010 Personal history of colonic polyps: Secondary | ICD-10-CM | POA: Diagnosis not present

## 2014-11-21 DIAGNOSIS — Z8709 Personal history of other diseases of the respiratory system: Secondary | ICD-10-CM | POA: Diagnosis not present

## 2014-11-21 DIAGNOSIS — Z136 Encounter for screening for cardiovascular disorders: Secondary | ICD-10-CM

## 2014-11-21 LAB — COMPREHENSIVE METABOLIC PANEL
ALK PHOS: 48 U/L (ref 39–117)
ALT: 14 U/L (ref 0–53)
AST: 16 U/L (ref 0–37)
Albumin: 3.9 g/dL (ref 3.5–5.2)
BUN: 12 mg/dL (ref 6–23)
CO2: 26 meq/L (ref 19–32)
CREATININE: 0.92 mg/dL (ref 0.50–1.35)
Calcium: 9.2 mg/dL (ref 8.4–10.5)
Chloride: 105 mEq/L (ref 96–112)
GLUCOSE: 72 mg/dL (ref 70–99)
POTASSIUM: 4.5 meq/L (ref 3.5–5.3)
SODIUM: 140 meq/L (ref 135–145)
Total Bilirubin: 0.6 mg/dL (ref 0.2–1.2)
Total Protein: 6.3 g/dL (ref 6.0–8.3)

## 2014-11-21 LAB — CBC WITH DIFFERENTIAL/PLATELET
Basophils Absolute: 0 10*3/uL (ref 0.0–0.1)
Basophils Relative: 0 % (ref 0–1)
Eosinophils Absolute: 0.1 10*3/uL (ref 0.0–0.7)
Eosinophils Relative: 2 % (ref 0–5)
HCT: 41.5 % (ref 39.0–52.0)
HEMOGLOBIN: 13.8 g/dL (ref 13.0–17.0)
LYMPHS ABS: 1.6 10*3/uL (ref 0.7–4.0)
LYMPHS PCT: 27 % (ref 12–46)
MCH: 27.9 pg (ref 26.0–34.0)
MCHC: 33.3 g/dL (ref 30.0–36.0)
MCV: 83.8 fL (ref 78.0–100.0)
MONO ABS: 0.4 10*3/uL (ref 0.1–1.0)
MONOS PCT: 7 % (ref 3–12)
MPV: 11.9 fL (ref 8.6–12.4)
Neutro Abs: 3.9 10*3/uL (ref 1.7–7.7)
Neutrophils Relative %: 64 % (ref 43–77)
PLATELETS: 195 10*3/uL (ref 150–400)
RBC: 4.95 MIL/uL (ref 4.22–5.81)
RDW: 13.3 % (ref 11.5–15.5)
WBC: 6.1 10*3/uL (ref 4.0–10.5)

## 2014-11-21 NOTE — Progress Notes (Signed)
   Subjective:    Patient ID: Phillip Ryan, male    DOB: [birth year], 64 y.o.   MRN: [id number]  HPI He is here after a several year absence. He is now on Medicare. He does have a previous history of colonic polyp but has not followed up on it since 2009. He does have a previous history of smoking. He also has a previous history of pleural effusion. He is not on any medications. He does drink socially. He is now retired. He has been married for over 20 years. He has no other concerns or complaints.   Review of Systems  All other systems reviewed and are negative.      Objective:   Physical Exam Alert and in no distress. Tympanic membranes and canals are normal. Pharyngeal area is normal. Neck is supple without adenopathy or thyromegaly. Cardiac exam shows a regular sinus rhythm without murmurs or gallops. Lungs are clear to auscultation.        Assessment & Plan:  Need for prophylactic vaccination against Streptococcus pneumoniae (pneumococcus) - Plan: Pneumococcal conjugate vaccine 13-valent  Screening for AAA (abdominal aortic aneurysm) - Plan: US Aorta Initial Medicare Screen  History of colonic polyps - Plan: Ambulatory referral to Gastroenterology  History of pleural effusion - Plan: CBC with Differential/Platelet, Comprehensive metabolic panel  I encouraged him to continue to take good care of himself.

## 2014-12-03 ENCOUNTER — Ambulatory Visit (HOSPITAL_COMMUNITY)
Admission: RE | Admit: 2014-12-03 | Discharge: 2014-12-03 | Disposition: A | Discharge status: Home / Self Care | Payer: PPO | Source: Ambulatory Visit | Attending: Family Medicine | Admitting: Family Medicine

## 2014-12-03 DIAGNOSIS — Z87891 Personal history of nicotine dependence: Secondary | ICD-10-CM | POA: Insufficient documentation

## 2014-12-03 DIAGNOSIS — Z136 Encounter for screening for cardiovascular disorders: Secondary | ICD-10-CM | POA: Insufficient documentation

## 2015-02-05 ENCOUNTER — Encounter: Payer: Self-pay | Admitting: Gastroenterology

## 2015-02-27 ENCOUNTER — Encounter: Payer: Self-pay | Admitting: Gastroenterology

## 2015-03-01 ENCOUNTER — Encounter: Payer: Self-pay | Admitting: Gastroenterology

## 2015-04-15 DEATH — deceased

## 2015-04-16 ENCOUNTER — Ambulatory Visit (AMBULATORY_SURGERY_CENTER): Payer: Self-pay

## 2015-04-16 VITALS — Ht 68.0 in | Wt 143.0 lb

## 2015-04-16 DIAGNOSIS — Z860100 Personal history of colon polyps, unspecified: Secondary | ICD-10-CM

## 2015-04-16 MED ORDER — SUPREP BOWEL PREP KIT 17.5-3.13-1.6 GM/177ML PO SOLN: 1.0000 | Freq: Once | ORAL | Status: DC

## 2015-04-16 NOTE — Progress Notes (Signed)
No allergies to eggs or soy No diet/weight loss meds No home oxygen No past problems with anesthesia  Refused Emmi instructions 

## 2015-04-30 ENCOUNTER — Encounter: Payer: Self-pay | Admitting: Gastroenterology

## 2015-04-30 ENCOUNTER — Ambulatory Visit (AMBULATORY_SURGERY_CENTER): Payer: PPO | Admitting: Gastroenterology

## 2015-04-30 VITALS — BP 122/75 | HR 57 | Temp 97.9°F | Resp 18 | Ht 68.0 in | Wt 143.0 lb

## 2015-04-30 DIAGNOSIS — Z86010 Personal history of colonic polyps: Secondary | ICD-10-CM

## 2015-04-30 DIAGNOSIS — D123 Benign neoplasm of transverse colon: Secondary | ICD-10-CM | POA: Diagnosis not present

## 2015-04-30 DIAGNOSIS — D124 Benign neoplasm of descending colon: Secondary | ICD-10-CM | POA: Diagnosis not present

## 2015-04-30 DIAGNOSIS — D122 Benign neoplasm of ascending colon: Secondary | ICD-10-CM

## 2015-04-30 MED ORDER — SODIUM CHLORIDE 0.9 % IV SOLN: 500.0000 mL | INTRAVENOUS | Status: DC

## 2015-04-30 NOTE — Op Note (Signed)
[place]  [employer] [address] [place], [address]   COLONOSCOPY PROCEDURE REPORT  PATIENT: Phillip Ryan, Phillip Ryan  MR#: [id number] BIRTHDATE: 02-18-1951 , 48  yrs. old GENDER: male ENDOSCOPIST: Ladene Artist, MD, [facility] PROCEDURE DATE:  04/30/2015 PROCEDURE:   Colonoscopy, surveillance , Colonoscopy with biopsy, and Colonoscopy with snare polypectomy First Screening Colonoscopy - Avg.  risk and is 50 yrs.  old or older - No.  Prior Negative Screening - Now for repeat screening. N/A  History of Adenoma - Now for follow-up colonoscopy & has been > or = to 3 yrs.  Yes hx of adenoma.  Has been 3 or more years since last colonoscopy.  Polyps removed today? Yes ASA CLASS:   Class II INDICATIONS:Surveillance due to prior colonic neoplasia and PH Colon Adenoma. MEDICATIONS: Monitored anesthesia care and Propofol 150 mg IV DESCRIPTION OF PROCEDURE:   After the risks benefits and alternatives of the procedure were thoroughly explained, informed consent was obtained.  The digital rectal exam revealed no abnormalities of the rectum.   The LB [id number]-H190 [id number]  endoscope was introduced through the anus and advanced to the cecum, which was identified by both the appendix and ileocecal valve. No adverse events experienced.   The quality of the prep was good.  (Suprep was used)  The instrument was then slowly withdrawn as the colon was fully examined. Estimated blood loss is zero unless otherwise noted in this procedure report.    COLON FINDINGS: Three sessile polyps measuring 4-6 mm in size were found in the transverse colon.  Polypectomies were performed with a cold snare (2) and with cold forceps (1).  The resection was complete, the polyp tissue was completely retrieved and sent to histology. Two sessile polyps measuring 6 mm in size were found in the descending colon and ascending colon.  Polypectomies were performed with a cold snare.  The resection was complete, the  polyp tissue was completely retrieved and sent to histology. The examination was otherwise normal.  Retroflexed views revealed internal Grade I hemorrhoids. The time to cecum = 2.0 Withdrawal time = 13.8   The scope was withdrawn and the procedure completed. COMPLICATIONS: There were no immediate complications.  ENDOSCOPIC IMPRESSION: 1.   Three sessile polyps in the transverse colon; polypectomies performed with a cold snare and with cold forceps 2.   Two sessile polyps in the descending colon and ascending colon; polypectomies performed with a cold snare 3.   Grade l internal hemorrhoids  RECOMMENDATIONS: 1.  Await pathology results 2.  Repeat colonoscopy in 3 years if 3-5 polyps adenomatous; otherwise 5 years  eSigned:  Ladene Artist, MD, [facility] 04/30/2015 2:41 PM

## 2015-04-30 NOTE — Progress Notes (Signed)
Called to room to assist during endoscopic procedure.  Patient ID and intended procedure confirmed with present staff. Received instructions for my participation in the procedure from the performing physician.  

## 2015-04-30 NOTE — Progress Notes (Signed)
Report to PACU, RN, vss, BBS= Clear.  

## 2015-04-30 NOTE — Patient Instructions (Signed)
YOU HAD AN ENDOSCOPIC PROCEDURE TODAY AT [facility]:   Refer to the procedure report that was given to you for any specific questions about what was found during the examination.  If the procedure report does not answer your questions, please call your gastroenterologist to clarify.  If you requested that your care partner not be given the details of your procedure findings, then the procedure report has been included in a sealed envelope for you to review at your convenience later.  YOU SHOULD EXPECT: Some feelings of bloating in the abdomen. Passage of more gas than usual.  Walking can help get rid of the air that was put into your GI tract during the procedure and reduce the bloating. If you had a lower endoscopy (such as a colonoscopy or flexible sigmoidoscopy) you may notice spotting of blood in your stool or on the toilet paper. If you underwent a bowel prep for your procedure, you may not have a normal bowel movement for a few days.  Please Note:  You might notice some irritation and congestion in your nose or some drainage.  This is from the oxygen used during your procedure.  There is no need for concern and it should clear up in a day or so.  SYMPTOMS TO REPORT IMMEDIATELY:   Following lower endoscopy (colonoscopy or flexible sigmoidoscopy):  Excessive amounts of blood in the stool  Significant tenderness or worsening of abdominal pains  Swelling of the abdomen that is new, acute  Fever of 100F or higher  For urgent or emergent issues, a gastroenterologist can be reached at any hour by calling [phone].   DIET: Your first meal following the procedure should be a small meal and then it is ok to progress to your normal diet. Heavy or fried foods are harder to digest and may make you feel nauseous or bloated.  Likewise, meals heavy in dairy and vegetables can increase bloating.  Drink plenty of fluids but you should avoid alcoholic beverages for 24  hours.  ACTIVITY:  You should plan to take it easy for the rest of today and you should NOT DRIVE or use heavy machinery until tomorrow (because of the sedation medicines used during the test).    FOLLOW UP: Our staff will call the number listed on your records the next business day following your procedure to check on you and address any questions or concerns that you may have regarding the information given to you following your procedure. If we do not reach you, we will leave a message.  However, if you are feeling well and you are not experiencing any problems, there is no need to return our call.  We will assume that you have returned to your regular daily activities without incident.  If any biopsies were taken you will be contacted by phone or by letter within the next 1-3 weeks.  Please call us at [phone] if you have not heard about the biopsies in 3 weeks.    SIGNATURES/CONFIDENTIALITY: You and/or your care partner have signed paperwork which will be entered into your electronic medical record.  These signatures attest to the fact that that the information above on your After Visit Summary has been reviewed and is understood.  Full responsibility of the confidentiality of this discharge information lies with you and/or your care -partner.  Await pathology results to determine next Colonoscopy  Polyps handout given

## 2015-05-01 ENCOUNTER — Telehealth: Payer: Self-pay

## 2015-05-01 NOTE — Telephone Encounter (Signed)
  Follow up Call-  Call back number 04/30/2015  Post procedure Call Back phone  # [phone]  Permission to leave phone message Yes     Patient questions:  Do you have a fever, pain , or abdominal swelling? No. Pain Score  0 *  Have you tolerated food without any problems? Yes.    Have you been able to return to your normal activities? Yes.    Do you have any questions about your discharge instructions: Diet   No. Medications  No. Follow up visit  No.  Do you have questions or concerns about your Care? No.  Actions: * If pain score is 4 or above: No action needed, pain <4.  No complaints per the pt. maw

## 2015-05-06 ENCOUNTER — Encounter: Payer: Self-pay | Admitting: Gastroenterology

## 2015-05-06 ENCOUNTER — Encounter: Payer: PPO | Admitting: Gastroenterology

## 2016-09-29 ENCOUNTER — Ambulatory Visit: Payer: PPO | Admitting: Family Medicine

## 2016-10-06 ENCOUNTER — Ambulatory Visit (INDEPENDENT_AMBULATORY_CARE_PROVIDER_SITE_OTHER): Payer: PPO | Admitting: Family Medicine

## 2016-10-06 ENCOUNTER — Encounter: Payer: Self-pay | Admitting: Family Medicine

## 2016-10-06 VITALS — BP 114/70 | HR 75 | Ht 68.5 in | Wt 135.4 lb

## 2016-10-06 DIAGNOSIS — Z13220 Encounter for screening for lipoid disorders: Secondary | ICD-10-CM | POA: Diagnosis not present

## 2016-10-06 DIAGNOSIS — Z86010 Personal history of colonic polyps: Secondary | ICD-10-CM

## 2016-10-06 DIAGNOSIS — R634 Abnormal weight loss: Secondary | ICD-10-CM | POA: Diagnosis not present

## 2016-10-06 DIAGNOSIS — Z125 Encounter for screening for malignant neoplasm of prostate: Secondary | ICD-10-CM | POA: Diagnosis not present

## 2016-10-06 DIAGNOSIS — R4702 Dysphasia: Secondary | ICD-10-CM

## 2016-10-06 DIAGNOSIS — Z0000 Encounter for general adult medical examination without abnormal findings: Secondary | ICD-10-CM | POA: Diagnosis not present

## 2016-10-06 DIAGNOSIS — Z23 Encounter for immunization: Secondary | ICD-10-CM

## 2016-10-06 LAB — PSA: PSA: 2.3 ng/mL (ref <=4.0)

## 2016-10-06 LAB — COMPREHENSIVE METABOLIC PANEL
ALBUMIN: 4 g/dL (ref 3.6–5.1)
ALK PHOS: 56 U/L (ref 40–115)
ALT: 20 U/L (ref 9–46)
AST: 18 U/L (ref 10–35)
BILIRUBIN TOTAL: 0.7 mg/dL (ref 0.2–1.2)
BUN: 8 mg/dL (ref 7–25)
CALCIUM: 9.5 mg/dL (ref 8.6–10.3)
CO2: 27 mmol/L (ref 20–31)
CREATININE: 0.96 mg/dL (ref 0.70–1.25)
Chloride: 104 mmol/L (ref 98–110)
Glucose, Bld: 84 mg/dL (ref 65–99)
Potassium: 4.1 mmol/L (ref 3.5–5.3)
SODIUM: 141 mmol/L (ref 135–146)
TOTAL PROTEIN: 6.6 g/dL (ref 6.1–8.1)

## 2016-10-06 LAB — CBC WITH DIFFERENTIAL/PLATELET
BASOS PCT: 0 %
Basophils Absolute: 0 cells/uL (ref 0–200)
EOS PCT: 4 %
Eosinophils Absolute: 172 cells/uL (ref 15–500)
HEMATOCRIT: 44.9 % (ref 38.5–50.0)
HEMOGLOBIN: 14.7 g/dL (ref 13.2–17.1)
Lymphocytes Relative: 47 %
Lymphs Abs: 2021 cells/uL (ref 850–3900)
MCH: 28.5 pg (ref 27.0–33.0)
MCHC: 32.7 g/dL (ref 32.0–36.0)
MCV: 87.2 fL (ref 80.0–100.0)
MONO ABS: 344 {cells}/uL (ref 200–950)
MPV: 11.4 fL (ref 7.5–12.5)
Monocytes Relative: 8 %
NEUTROS ABS: 1763 {cells}/uL (ref 1500–7800)
Neutrophils Relative %: 41 %
Platelets: 208 10*3/uL (ref 140–400)
RBC: 5.15 MIL/uL (ref 4.20–5.80)
RDW: 12.7 % (ref 11.0–15.0)
WBC: 4.3 10*3/uL (ref 4.0–10.5)

## 2016-10-06 LAB — LIPID PANEL
CHOLESTEROL: 149 mg/dL (ref <200)
HDL: 58 mg/dL (ref >40)
LDL CALC: 78 mg/dL (ref <100)
TRIGLYCERIDES: 65 mg/dL (ref <150)
Total CHOL/HDL Ratio: 2.6 Ratio (ref <5.0)
VLDL: 13 mg/dL (ref <30)

## 2016-10-06 NOTE — Progress Notes (Signed)
Subjective:   HPI  Phillip Ryan is a 66 y.o. male who presents for a complete physical.  Medical care team includes:  [facility]   Preventative care: Last ophthalmology visit: 2007 Last dental visit: 6 months Last colonoscopy:04/30/15 Last prostate exam: ? Last EKG:? Last labs:11/21/14  Prior vaccinations: TD or Tdap:11/25/12Influenza: Pneumococcal:11/21/14 Shingles/Zostavax:no  Advanced directive: None. Information given.  Concerns: His main complaint is feeling as if food is getting stuck in his upper throat area. He states that this has been the same since he had cervical neck surgery several years ago. He also does tend to clear his throat. There has been a 30 pound weight loss over the last several years. He states that he is just not hungry and therefore not eating. Does have a previous history of colonic polyps. He does not complain of nausea, vomiting, early satiety, constipation or diarrhea. He does not smoke and alcohol consumption is moderate.  Reviewed their medical, surgical, family, social, medication, and allergy history and updated chart as appropriate.    Review of Systems Constitutional: -fever, -chills, -sweats, -unexpected weight change, -decreased appetite, -fatigue Allergy: -sneezing, -itching, -congestion  Cardiology: -chest pain, -palpitations, -swelling, -difficulty breathing when lying flat, -waking up short of breath Respiratory: -cough, -shortness of breath, -difficulty breathing with exercise or exertion, -wheezing, -coughing up blood Hematology: -bleeding, -bruising  Musculoskeletal: -joint aches, -muscle aches, -joint swelling, -back pain, -neck pain, -cramping, -changes in gait Ophthalmology: denies vision changes, eye redness, itching, discharge Urology: -burning with urination, -difficulty urinating, -blood in urine, -urinary frequency, -urgency, -incontinence Neurology: -headache, -weakness, -tingling, -numbness, -memory loss, -falls,  -dizziness Psychology: -depressed mood, -agitation, -sleep problems     Objective:   Physical Exam   General appearance: alert, no distress, WD/WN,  Skin:Normal HEENT: normocephalic, conjunctiva/corneas normal, sclerae anicteric, PERRLA, EOMi, nares patent, no discharge or erythema, pharynx normal Oral cavity: MMM, tongue normal, teeth normal Neck: supple, no lymphadenopathy, no thyromegaly, no masses, normal ROM Chest: non tender, normal shape and expansion Heart: RRR, normal S1, S2, no murmurs Lungs: CTA bilaterally, no wheezes, rhonchi, or rales Abdomen: +bs, soft, non tender, non distended, no masses, no hepatomegaly, no splenomegaly, no bruits Back: non tender, normal ROM, no scoliosis Musculoskeletal: upper extremities non tender, no obvious deformity, normal ROM throughout, lower extremities non tender, no obvious deformity, normal ROM throughout Extremities: no edema, no cyanosis, no clubbing Pulses: 2+ symmetric, upper and lower extremities, normal cap refill Neurological: alert, oriented x 3, CN2-12 intact, strength normal upper extremities and lower extremities, sensation normal throughout, DTRs 2+ throughout, no cerebellar signs, gait normal Psychiatric: normal affect, behavior normal, pleasant  GU: normal male external genitalia, nontender, no masses, no hernia, no lymphadenopathy     Assessment and Plan :   Loss of weight - Plan: CBC with Differential/Platelet, Comprehensive metabolic panel, Lipid panel, Ambulatory referral to Gastroenterology  History of colonic polyps - Plan: CBC with Differential/Platelet, Comprehensive metabolic panel  Dysphasia - Plan: CBC with Differential/Platelet, Comprehensive metabolic panel, Ambulatory referral to Gastroenterology  Screening for prostate cancer - Plan: PSA  Need for prophylactic vaccination against Streptococcus pneumoniae (pneumococcus) - Plan: Pneumococcal polysaccharide vaccine 23-valent greater than or equal to 2yo  subcutaneous/IM  Screening for lipid disorders - Plan: Lipid panel The etiology of his symptoms is unclear but could be related to his surgery and possible mechanical problems related to that. With his weight loss I think referral is appropriate. Patient refuses flu shot Physical exam - discussed healthy lifestyle, diet, exercise, preventative care,  vaccinations, and addressed their concerns.   Follow-up

## 2016-11-16 ENCOUNTER — Ambulatory Visit: Payer: PPO | Admitting: Gastroenterology

## 2016-11-18 ENCOUNTER — Encounter: Payer: Self-pay | Admitting: Gastroenterology

## 2016-11-18 ENCOUNTER — Other Ambulatory Visit (HOSPITAL_COMMUNITY): Payer: Self-pay | Admitting: Gastroenterology

## 2016-11-18 ENCOUNTER — Ambulatory Visit (INDEPENDENT_AMBULATORY_CARE_PROVIDER_SITE_OTHER): Payer: PPO | Admitting: Gastroenterology

## 2016-11-18 VITALS — BP 110/76 | HR 68 | Ht 68.5 in | Wt 140.0 lb

## 2016-11-18 DIAGNOSIS — R634 Abnormal weight loss: Secondary | ICD-10-CM

## 2016-11-18 DIAGNOSIS — R1314 Dysphagia, pharyngoesophageal phase: Secondary | ICD-10-CM

## 2016-11-18 DIAGNOSIS — R1319 Other dysphagia: Secondary | ICD-10-CM

## 2016-11-18 NOTE — Progress Notes (Signed)
    History of Present Illness: This is a 66 year old male complaining of difficulty swallowing and weight loss. He is accompanied by his daughter. Patient relates frequent difficulty swallowing liquids and solids since his cervical spine fusion in 2008. Previously evaluated him for this problem in 2009. See barium esophagram below. The patient has had a gradual weight loss now his weight has slightly increased. The patient and the daughter both relate that he frequently skips meals because he "too lazy"". His appetite is good. Denies abdominal pain, constipation, diarrhea, change in stool caliber, melena, hematochezia, nausea, vomiting, reflux symptoms, chest pain.   Colonoscopy 04/2015 IMPRESSION: 1. Three sessile polyps in the transverse colon; polypectomies performed with a cold snare and with cold forceps 2. Two sessile polyps in the descending colon and ascending colon; polypectomies performed with a cold snare 3. Grade l internal hemorrhoids  Barium esophagram 05/2008 IMPRESSION:   1.  Fully fused satisfactory appearing anterior cervical fusion with hardware C3-C6 without extrinsic mass effect upon the hypopharynx/cervical esophagus. 2.  Single episode slight distal esophageal spasm. 3.  Slightly prominent longitudinal folds mid to distal thoracic esophagus may represent slight esophagitis. (Consider gastroesophageal reflux disease esophagitis despite no current gastroesophageal reflux demonstrated). 4.  Otherwise, normal.  Current Medications, Allergies, Past Medical History, Past Surgical History, Family History and Social History were reviewed in [employer] record.  Physical Exam: General: Well developed, well nourished, no acute distress Head: Normocephalic and atraumatic Eyes:  sclerae anicteric, EOMI Ears: Normal auditory acuity Mouth: No deformity or lesions Lungs: Clear throughout to auscultation Heart: Regular rate and rhythm; no murmurs, rubs or  bruits Abdomen: Soft, non tender and non distended. No masses, hepatosplenomegaly or hernias noted. Normal Bowel sounds Musculoskeletal: Symmetrical with no gross deformities  Pulses:  Normal pulses noted Extremities: No clubbing, cyanosis, edema or deformities noted Neurological: Alert oriented x 4, grossly nonfocal Psychological:  Alert and cooperative. Normal mood and affect  Assessment and Recommendations:  1. Weight loss. Pt and daughter relate the he frequently misses meals. He is encouraged to eat 3 meals per day and use a high protein nutritional liquid supplement in between meals or in place of meals if he going to skip a meal. Follow up with PCP.  2. Dysphagia very likely pharyngoesophageal related to prior cervical spine fusion. Schedule modified barium swallow study and barium esophagram to further evaluate. Pending findings may need EGD for further evaluation.  3. Personal history of adenomatous colon polyps. Surveillance colonoscopy 04/2018.

## 2016-11-18 NOTE — Patient Instructions (Signed)
You have been scheduled for a modified barium swallow on 11/27/16 at 11:00am. Please arrive 15 minutes prior to your test for registration. You will go to [facility] (1st Floor) for your appointment. Please refrain from eating or drinking anything 4 hours prior to your test. Should you need to cancel or reschedule your appointment, please contact [phone] [facility]) or [phone] [place]). _____________________________________________________________________ A Modified Barium Swallow Study, or MBS, is a special x-ray that is taken to check swallowing skills. It is carried out by a Stage manager and a Psychologist, clinical (SLP). During this test, yourmouth, throat, and esophagus, a muscular tube which connects your mouth to your stomach, is checked. The test will help you, your doctor, and the SLP plan what types of foods and liquids are easier for you to swallow. The SLP will also identify positions and ways to help you swallow more easily and safely. What will happen during an MBS? You will be taken to an x-ray room and seated comfortably. You will be asked to swallow small amounts of food and liquid mixed with barium. Barium is a liquid or paste that allows images of your mouth, throat and esophagus to be seen on x-ray. The x-ray captures moving images of the food you are swallowing as it travels from your mouth through your throat and into your esophagus. This test helps identify whether food or liquid is entering your lungs (aspiration). The test also shows which part of your mouth or throat lacks strength or coordination to move the food or liquid in the right direction. This test typically takes 30 minutes to 1 hour to complete. _______________________________________________________________________  Thank you for choosing me and [facility].  Pricilla Riffle. Dagoberto Ligas., MD., [facility]

## 2016-11-27 ENCOUNTER — Ambulatory Visit (HOSPITAL_COMMUNITY)
Admission: RE | Admit: 2016-11-27 | Discharge: 2016-11-27 | Disposition: A | Discharge status: Home / Self Care | Payer: PPO | Source: Ambulatory Visit | Attending: Gastroenterology | Admitting: Gastroenterology

## 2016-11-27 DIAGNOSIS — R634 Abnormal weight loss: Secondary | ICD-10-CM | POA: Insufficient documentation

## 2016-11-27 DIAGNOSIS — R1310 Dysphagia, unspecified: Secondary | ICD-10-CM | POA: Diagnosis not present

## 2016-11-27 DIAGNOSIS — R1314 Dysphagia, pharyngoesophageal phase: Secondary | ICD-10-CM | POA: Diagnosis not present

## 2016-11-27 DIAGNOSIS — R1319 Other dysphagia: Secondary | ICD-10-CM

## 2016-11-27 NOTE — Progress Notes (Signed)
   11/27/16 1600  SLP G-Codes **NOT FOR INPATIENT CLASS**  Functional Assessment Tool Used clinical judgement  Functional Limitations Swallowing  Swallow Current Status (G8811) CI  Swallow Goal Status (S3159) CI  Swallow Discharge Status (Y5859) CI  SLP Evaluations  $ SLP Speech Visit 1 Procedure  SLP Evaluations  $MBS Swallow Outpatient 1 Procedure

## 2016-12-23 NOTE — Progress Notes (Signed)
   11/27/16 1600  SLP G-Codes **NOT FOR INPATIENT CLASS**  Functional Assessment Tool Used clinical judgement  Functional Limitations Swallowing  Swallow Current Status (Q1194) CI  Swallow Goal Status (R7408) CI  Swallow Discharge Status (X4481) CI  SLP Evaluations  $ SLP Speech Visit 1 Procedure  SLP Evaluations  $MBS Swallow Outpatient 1 Procedure

## 2016-12-23 NOTE — Addendum Note (Signed)
Encounter addended by: Jerene Pitch, CCC-SLP on: 12/23/2016  8:48 AM<BR>    Actions taken: Sign clinical note, Flowsheet accepted

## 2018-03-07 ENCOUNTER — Encounter: Payer: Self-pay | Admitting: Gastroenterology

## 2018-05-01 IMAGING — RF DG SWALLOWING FUNCTION
15 series · 24 of 24 positions shown · non-contrast
Comparison: None.

CLINICAL DATA: Symptoms of food sticking since ACDF

EXAM:
MODIFIED BARIUM SWALLOW
TECHNIQUE: Different consistencies of barium were administered orally to the
patient by the Speech Pathologist. Imaging of the pharynx was
performed in the lateral projection.
FLUOROSCOPY TIME:  Fluoroscopy Time:  2 minutes 6 seconds
Radiation Exposure Index (if provided by the fluoroscopic device):
12.8 mGy
Number of Acquired Spot Images: 0

[Series 1: cp_standard · 0.26mm/px · 1 of 1 slices shown (1 of 15)]
[im 1/1]
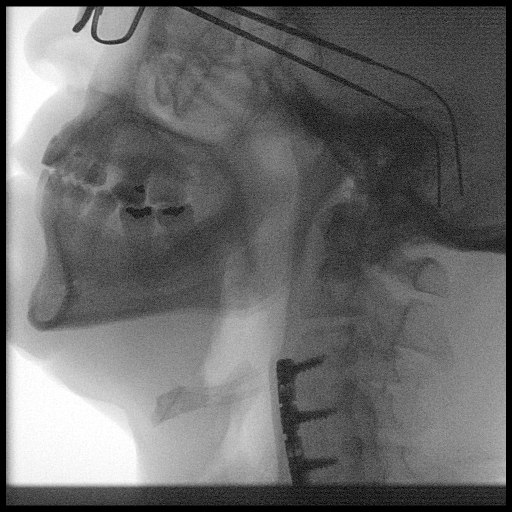

[Series 2: cp_standard · 0.34mm/px · 1 of 141 frames shown (2 of 15)]
[frame 71/141]
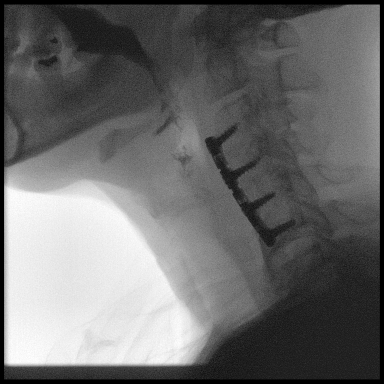

[Series 3: cp_standard · 0.34mm/px · 2 of 82 frames shown (3 of 15)]
[frame 13/82]
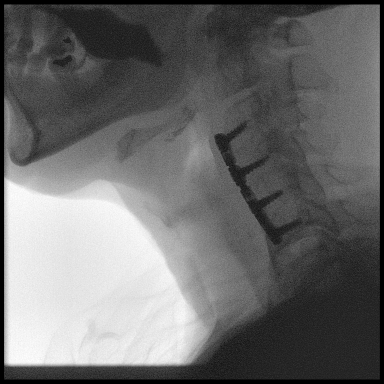
[frame 50/82]
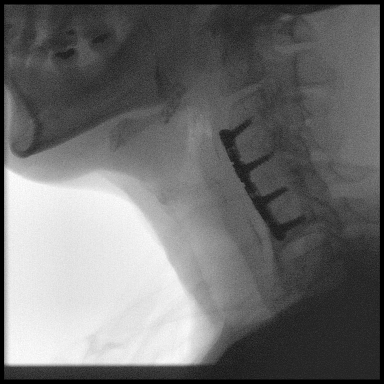

[Series 4: cp_standard · 0.34mm/px · 2 of 49 frames shown (4 of 15)]
[frame 8/49]
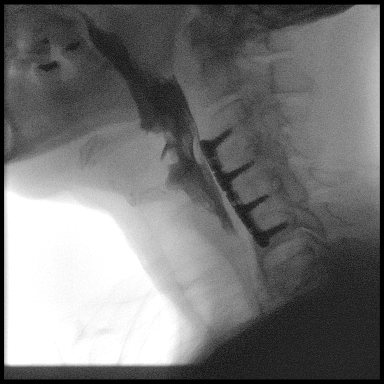
[frame 42/49]
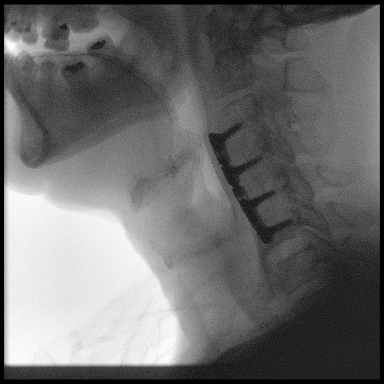

[Series 5: cp_standard · 0.34mm/px · 1 of 116 frames shown (5 of 15)]
[frame 59/116]
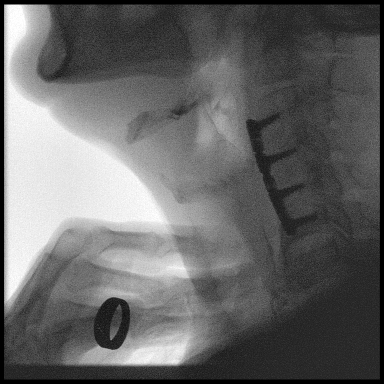

[Series 6: cp_standard · 0.34mm/px · 2 of 214 frames shown (6 of 15)]
[frame 33/214]
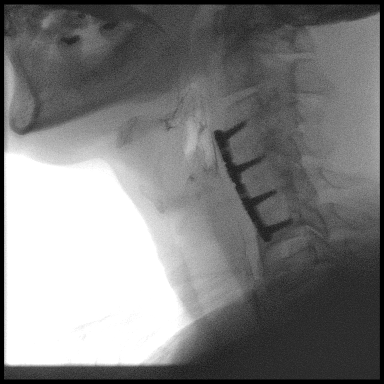
[frame 182/214]
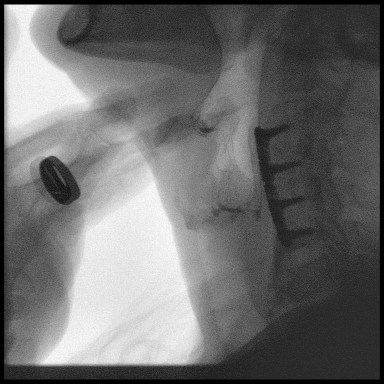

[Series 7: cp_standard · 0.34mm/px · 2 of 80 frames shown (7 of 15)]
[frame 41/80]
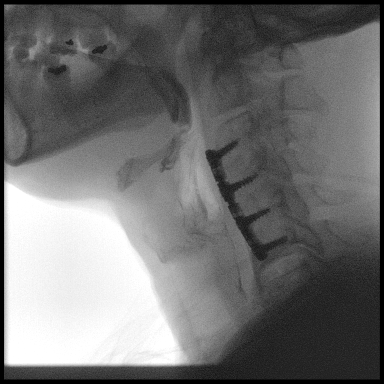
[frame 80/80]
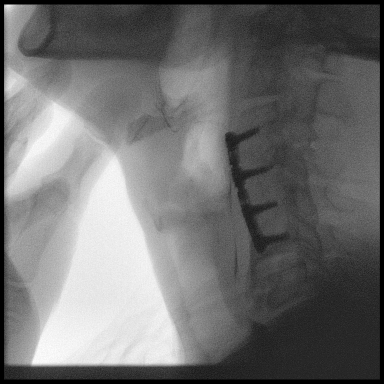

[Series 8: cp_standard · 0.34mm/px · 1 of 74 frames shown (8 of 15)]
[frame 63/74]
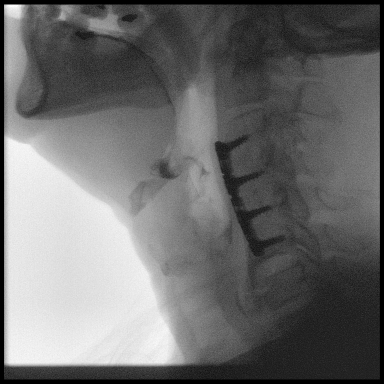

[Series 9: cp_standard · 0.34mm/px · 2 of 28 frames shown (9 of 15)]
[frame 5/28]
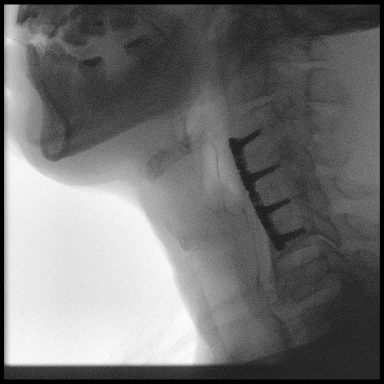
[frame 24/28]
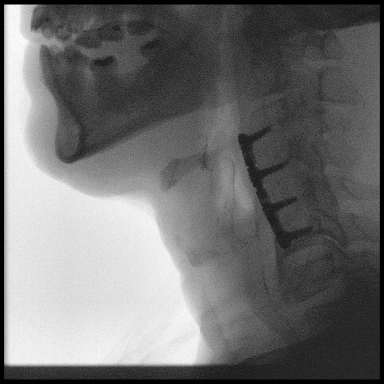

[Series 10: cp_standard · 0.34mm/px · 1 of 169 frames shown (10 of 15)]
[frame 85/169]
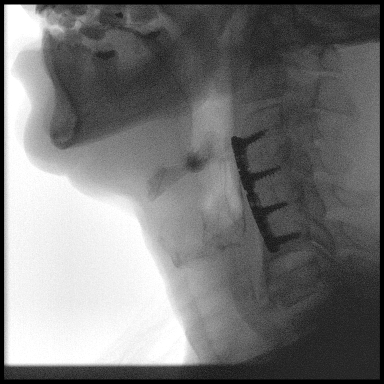

[Series 11: cp_standard · 0.34mm/px · 2 of 201 frames shown (11 of 15)]
[frame 31/201]
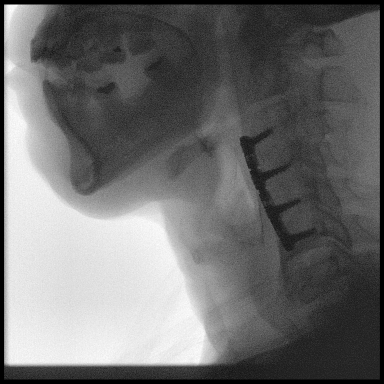
[frame 171/201]
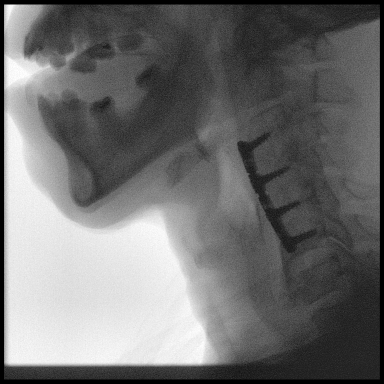

[Series 12: cp_standard · 0.34mm/px · 2 of 75 frames shown (12 of 15)]
[frame 12/75]
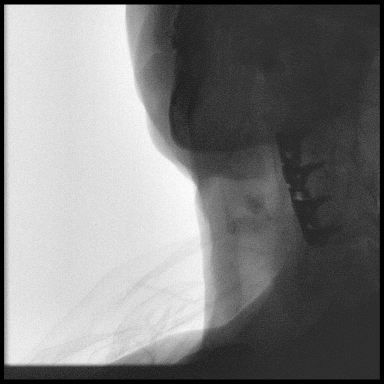
[frame 64/75]
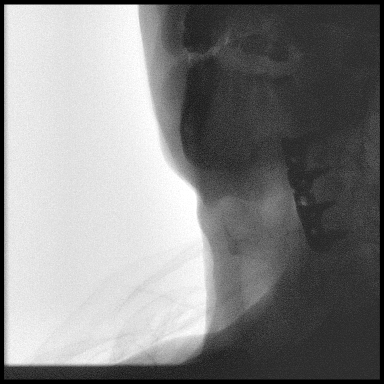

[Series 13: cp_standard · 0.34mm/px · 1 of 227 frames shown (13 of 15)]
[frame 114/227]
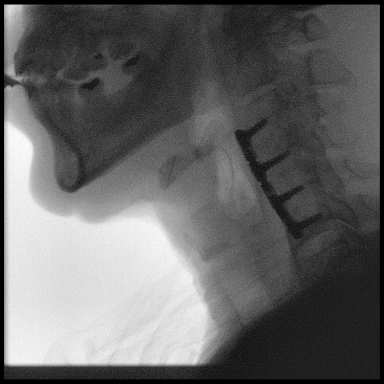

[Series 14: cp_standard · 0.34mm/px · 2 of 31 frames shown (14 of 15)]
[frame 5/31]
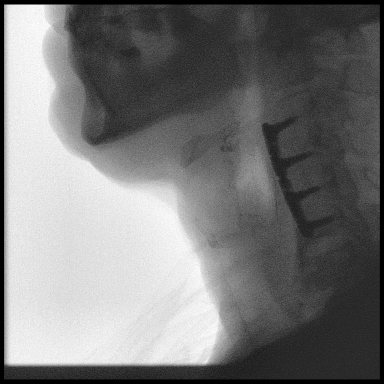
[frame 16/31]
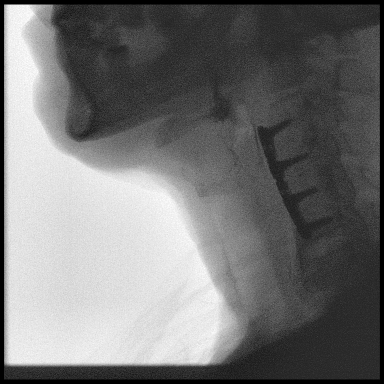

[Series 15: cp_standard · 0.34mm/px · 2 of 57 frames shown (15 of 15)]
[frame 29/57]
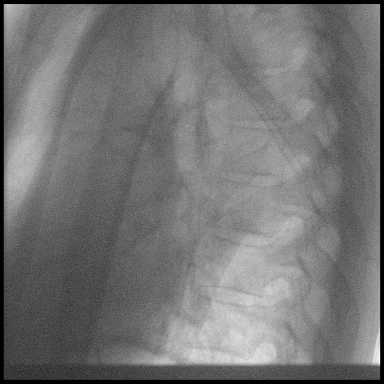
[frame 49/57]
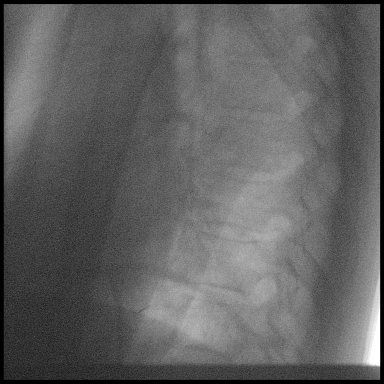

[24 of 24 positions shown; findings below may reference images not displayed]

FINDINGS: Barium meal ranging from thin to solid consistency was provided to
the patient without incident. C3-C6 ventral plate for ACDF. There is
solid bony fusion and no unexpected plate positioning. C6-7 ventral
spurring mildly deforms the cervical esophagus, without obstruction
or related stasis. Despite the plate, there is good epiglottis
turnover. A barium tablet was provided to the patient. He reports
that he never takes pills and had some difficulty with passage,
including small volume symptomatic aspiration.
IMPRESSION: Completed pharyngeal function study as above. Please refer to the
Speech Pathologists report for complete details and recommendations.

## 2018-05-26 ENCOUNTER — Ambulatory Visit: Payer: Self-pay | Admitting: Family Medicine

## 2018-09-14 HISTORY — PX: COLONOSCOPY: SHX174

## 2018-09-16 ENCOUNTER — Encounter: Payer: Self-pay | Admitting: Family Medicine

## 2018-09-16 ENCOUNTER — Ambulatory Visit: Payer: Self-pay | Admitting: Family Medicine

## 2018-09-16 ENCOUNTER — Ambulatory Visit (INDEPENDENT_AMBULATORY_CARE_PROVIDER_SITE_OTHER): Payer: PPO | Admitting: Family Medicine

## 2018-09-16 VITALS — BP 130/82 | HR 62 | Temp 98.5°F | Wt 142.0 lb

## 2018-09-16 DIAGNOSIS — Z1159 Encounter for screening for other viral diseases: Secondary | ICD-10-CM

## 2018-09-16 DIAGNOSIS — Z860100 Personal history of colon polyps, unspecified: Secondary | ICD-10-CM

## 2018-09-16 NOTE — Progress Notes (Signed)
Phillip Ryan is a 68 y.o. male who presents for annual wellness visit and follow-up on chronic medical conditions.  He was scheduled for colonoscopy however he did not have the co-pay.  He has no other concerns or complaints.  He is not on any medications.  He is not interested in the flu shot today.  Does not smoke or drink.   Immunizations and Health Maintenance Immunization History  Administered Date(s) Administered  . Pneumococcal Conjugate-13 11/21/2014  . Pneumococcal Polysaccharide-23 10/06/2016  . Tdap 08/09/2011   Health Maintenance Due  Topic Date Due  . Hepatitis C Screening  Feb 01, 1951  . COLONOSCOPY  04/29/2018    Last colonoscopy:04/30/2015 Last PSA:10-06-16 Dentist:end of nov 2019 Ophtho: dec 2019 Exercise: no exercise   Other doctors caring for patient include: [facility]  Advanced Directives:no info given    Depression screen:  See questionnaire below.     Depression screen [facility] 2/9 09/16/2018 10/06/2016 11/21/2014  Decreased Interest 0 0 0  Down, Depressed, Hopeless 0 0 0  PHQ - 2 Score 0 0 0    Fall Screen: See Questionaire below.   Fall Risk  09/16/2018 10/06/2016 11/21/2014  Falls in the past year? 0 No No    ADL screen:  See questionnaire below.  Functional Status Survey: Is the patient deaf or have difficulty hearing?: No Does the patient have difficulty seeing, even when wearing glasses/contacts?: No Does the patient have difficulty concentrating, remembering, or making decisions?: No Does the patient have difficulty walking or climbing stairs?: No Does the patient have difficulty dressing or bathing?: No Does the patient have difficulty doing errands alone such as visiting a doctor's office or shopping?: No   Review of Systems  Constitutional: -, -unexpected weight change, -anorexia, -fatigue Allergy: -sneezing, -itching, -congestion Dermatology: denies changing moles, rash, lumps ENT: -runny nose, -ear pain, -sore throat,  Cardiology:  -chest  pain, -palpitations, -orthopnea, Respiratory: -cough, -shortness of breath, -dyspnea on exertion, -wheezing,  Gastroenterology: -abdominal pain, -nausea, -vomiting, -diarrhea, -constipation, -dysphagia Hematology: -bleeding or bruising problems Musculoskeletal: -arthralgias, -myalgias, -joint swelling, -back pain, - Ophthalmology: -vision changes,  Urology: -dysuria, -difficulty urinating,  -urinary frequency, -urgency, incontinence Neurology: -, -numbness, , -memory loss, -falls, -dizziness    PHYSICAL EXAM:  BP 130/82 (BP Location: Left Arm, Patient Position: Sitting)   Pulse 62   Temp 98.5 F (36.9 C)   Wt 142 lb (64.4 kg)   SpO2 99%   BMI 21.28 kg/m   General Appearance: Alert, cooperative, no distress, appears stated age Head: Normocephalic, without obvious abnormality, atraumatic Eyes: PERRL, conjunctiva/corneas clear, EOM's intact, fundi benign Ears: Normal TM's and external ear canals Nose: Nares normal, mucosa normal, no drainage or sinus   tenderness Throat: Lips, mucosa, and tongue normal; teeth and gums normal Neck: Supple, no lymphadenopathy, thyroid:no enlargement/tenderness/nodules; no carotid bruit or JVD Lungs: Clear to auscultation bilaterally without wheezes, rales or ronchi; respirations unlabored Heart: Regular rate and rhythm, S1 and S2 normal, no murmur, rub or gallop Abdomen: Soft, non-tender, nondistended, normoactive bowel sounds, no masses, no hepatosplenomegaly Extremities: No clubbing, cyanosis or edema Pulses: 2+ and symmetric all extremities Skin: Skin color, texture, turgor normal, no rashes or lesions Lymph nodes: Cervical, supraclavicular, and axillary nodes normal Neurologic: CNII-XII intact, normal strength, sensation and gait; reflexes 2+ and symmetric throughout   Psych: Normal mood, affect, hygiene and grooming  ASSESSMENT/PLAN: History of colonic polyps - Plan: Ambulatory referral to Gastroenterology, CBC with Differential/Platelet,  Comprehensive metabolic panel  Encounter for hepatitis C screening  test for low risk patient - Plan: Hepatitis C antibody      recommended at least 30 minutes of aerobic activity at least 5 days/week;  healthy diet and alcohol recommendations (less than or equal to 2 drinks/day)  immunization recommendations discussed.  Colonoscopy recommendations reviewed.   Medicare Attestation I have personally reviewed: The patient's medical and social history Their use of alcohol, tobacco or illicit drugs Their current medications and supplements The patient's functional ability including ADLs,fall risks, home safety risks, cognitive, and hearing and visual impairment Diet and physical activities Evidence for depression or mood disorders  The patient's weight, height, and BMI have been recorded in the chart.  I have made referrals, counseling, and provided education to the patient based on review of the above and I have provided the patient with a written personalized care plan for preventive services.     Phillip Alexanders, MD   09/16/2018

## 2018-09-16 NOTE — Patient Instructions (Signed)
  Phillip Ryan , Thank you for taking time to come for your Medicare Wellness Visit. I appreciate your ongoing commitment to your health goals. Please review the following plan we discussed and let me know if I can assist you in the future.   These are the goals we discussed: Goals   None     This is a list of the screening recommended for you and due dates:  Health Maintenance  Topic Date Due  .  Hepatitis C: One time screening is recommended by Center for Disease Control  (CDC) for  adults born from [birth year] through 1966.   [birth year]  . Colon Cancer Screening  04/29/2018  . Flu Shot  12/13/2018*  . Tetanus Vaccine  08/08/2021  . Pneumonia vaccines  Completed  *Topic was postponed. The date shown is not the original due date.

## 2018-09-17 LAB — CBC WITH DIFFERENTIAL/PLATELET
Basophils Absolute: 0 10*3/uL (ref 0.0–0.2)
Basos: 1 %
EOS (ABSOLUTE): 0.1 10*3/uL (ref 0.0–0.4)
EOS: 2 %
HEMATOCRIT: 43 % (ref 37.5–51.0)
Hemoglobin: 14.4 g/dL (ref 13.0–17.7)
IMMATURE GRANS (ABS): 0 10*3/uL (ref 0.0–0.1)
Immature Granulocytes: 0 %
LYMPHS ABS: 2.1 10*3/uL (ref 0.7–3.1)
LYMPHS: 34 %
MCH: 28.4 pg (ref 26.6–33.0)
MCHC: 33.5 g/dL (ref 31.5–35.7)
MCV: 85 fL (ref 79–97)
MONOCYTES: 8 %
Monocytes Absolute: 0.5 10*3/uL (ref 0.1–0.9)
NEUTROS ABS: 3.3 10*3/uL (ref 1.4–7.0)
Neutrophils: 55 %
Platelets: 239 10*3/uL (ref 150–450)
RBC: 5.07 x10E6/uL (ref 4.14–5.80)
RDW: 11.7 % — ABNORMAL LOW (ref 12.3–15.4)
WBC: 6.1 10*3/uL (ref 3.4–10.8)

## 2018-09-17 LAB — COMPREHENSIVE METABOLIC PANEL
ALK PHOS: 71 IU/L (ref 39–117)
ALT: 20 IU/L (ref 0–44)
AST: 17 IU/L (ref 0–40)
Albumin/Globulin Ratio: 2 (ref 1.2–2.2)
Albumin: 4.3 g/dL (ref 3.6–4.8)
BUN/Creatinine Ratio: 8 — ABNORMAL LOW (ref 10–24)
BUN: 8 mg/dL (ref 8–27)
Bilirubin Total: 0.4 mg/dL (ref 0.0–1.2)
CHLORIDE: 100 mmol/L (ref 96–106)
CO2: 25 mmol/L (ref 20–29)
Calcium: 9.3 mg/dL (ref 8.6–10.2)
Creatinine, Ser: 0.99 mg/dL (ref 0.76–1.27)
GFR calc Af Amer: 90 mL/min/{1.73_m2} (ref >59)
GFR, EST NON AFRICAN AMERICAN: 78 mL/min/{1.73_m2} (ref >59)
GLOBULIN, TOTAL: 2.1 g/dL (ref 1.5–4.5)
Glucose: 77 mg/dL (ref 65–99)
POTASSIUM: 4.6 mmol/L (ref 3.5–5.2)
Sodium: 141 mmol/L (ref 134–144)
TOTAL PROTEIN: 6.4 g/dL (ref 6.0–8.5)

## 2018-09-17 LAB — HEPATITIS C ANTIBODY: Hep C Virus Ab: <0.1 s/co ratio (ref 0.0–0.9)

## 2018-09-19 ENCOUNTER — Encounter: Payer: Self-pay | Admitting: Gastroenterology

## 2018-10-07 ENCOUNTER — Ambulatory Visit (AMBULATORY_SURGERY_CENTER): Payer: Self-pay

## 2018-10-07 ENCOUNTER — Encounter: Payer: Self-pay | Admitting: Gastroenterology

## 2018-10-07 VITALS — Ht 68.0 in | Wt 143.2 lb

## 2018-10-07 DIAGNOSIS — Z86010 Personal history of colonic polyps: Secondary | ICD-10-CM

## 2018-10-07 MED ORDER — NA SULFATE-K SULFATE-MG SULF 17.5-3.13-1.6 GM/177ML PO SOLN: 1.0000 | Freq: Once | ORAL | 0 refills | Status: AC

## 2018-10-07 NOTE — Progress Notes (Signed)
Denies allergies to eggs or soy products. Denies complication of anesthesia or sedation. Denies use of weight loss medication. Denies use of O2.   Emmi instructions declined.  

## 2018-10-10 ENCOUNTER — Telehealth: Payer: Self-pay | Admitting: Gastroenterology

## 2018-10-10 NOTE — Telephone Encounter (Signed)
Pt called said Suprep was 90$--  He did pay for it but is not happy that it was so expensive.   I explained that [facility] has a high co pay- Now that's hes paid for it there is nothing we can do -- I explained had he called before the paid that price, we may could have gotten a sample   Pt verbalized understanding  Lelan Pons PV

## 2018-10-10 NOTE — Telephone Encounter (Signed)
Pt called in about the prep that he had to pick up and the price

## 2018-10-21 ENCOUNTER — Encounter: Payer: Self-pay | Admitting: Gastroenterology

## 2018-10-21 ENCOUNTER — Encounter: Payer: PPO | Admitting: Gastroenterology

## 2018-10-21 ENCOUNTER — Ambulatory Visit (AMBULATORY_SURGERY_CENTER): Payer: PPO | Admitting: Gastroenterology

## 2018-10-21 VITALS — BP 112/59 | HR 59 | Temp 97.8°F | Resp 20 | Ht 68.0 in | Wt 143.0 lb

## 2018-10-21 DIAGNOSIS — Z1211 Encounter for screening for malignant neoplasm of colon: Secondary | ICD-10-CM | POA: Diagnosis not present

## 2018-10-21 DIAGNOSIS — D123 Benign neoplasm of transverse colon: Secondary | ICD-10-CM

## 2018-10-21 DIAGNOSIS — D124 Benign neoplasm of descending colon: Secondary | ICD-10-CM

## 2018-10-21 DIAGNOSIS — D120 Benign neoplasm of cecum: Secondary | ICD-10-CM | POA: Diagnosis not present

## 2018-10-21 DIAGNOSIS — Z86010 Personal history of colonic polyps: Secondary | ICD-10-CM | POA: Diagnosis not present

## 2018-10-21 MED ORDER — SODIUM CHLORIDE 0.9 % IV SOLN: 500.0000 mL | Freq: Once | INTRAVENOUS | Status: DC

## 2018-10-21 NOTE — Op Note (Signed)
[facility] Patient Name: Phillip Ryan Procedure Date: 10/21/2018 2:07 PM MRN: [id number] Endoscopist: Ladene Artist , MD Age: 68 Referring MD:  Date of Birth: [birth year] Gender: Male Account #: [id number] Procedure:                Colonoscopy Indications:              Surveillance: Personal history of adenomatous                            polyps on last colonoscopy > 3 years ago Medicines:                Monitored Anesthesia Care Procedure:                Pre-Anesthesia Assessment:                           - Prior to the procedure, a History and Physical                            was performed, and patient medications and                            allergies were reviewed. The patient's tolerance of                            previous anesthesia was also reviewed. The risks                            and benefits of the procedure and the sedation                            options and risks were discussed with the patient.                            All questions were answered, and informed consent                            was obtained. Prior Anticoagulants: The patient has                            taken no previous anticoagulant or antiplatelet                            agents. ASA Grade Assessment: II - A patient with                            mild systemic disease. After reviewing the risks                            and benefits, the patient was deemed in                            satisfactory condition to undergo the procedure.  After obtaining informed consent, the colonoscope                            was passed under direct vision. Throughout the                            procedure, the patient's blood pressure, pulse, and                            oxygen saturations were monitored continuously. The                            Colonoscope was introduced through the anus and                            advanced to the the  cecum, identified by                            appendiceal orifice and ileocecal valve. The                            ileocecal valve, appendiceal orifice, and rectum                            were photographed. The quality of the bowel                            preparation was good. The colonoscopy was performed                            without difficulty. The patient tolerated the                            procedure well. Scope In: 2:12:57 PM Scope Out: 2:31:58 PM Scope Withdrawal Time: 0 hours 16 minutes 15 seconds  Total Procedure Duration: 0 hours 19 minutes 1 second  Findings:                 The perianal and digital rectal examinations were                            normal.                           Two sessile polyps were found in the ileocecal                            valve. The polyps were 4 to 6 mm in size. These                            polyps were removed with a cold snare. Resection                            and retrieval were complete.  A 10 mm polyp was found in the transverse colon.                            The polyp was sessile. The polyp was removed with a                            cold snare. Resection complete and the smaller                            polyp was not retrieved.                           A 6 mm polyp was found in the transverse colon. The                            polyp was sessile. The polyp was removed with a                            cold snare. Resection and retrieval were complete.                           A 3 mm polyp was found in the descending colon. The                            polyp was sessile. The polyp was removed with a                            cold biopsy forceps. Resection and retrieval were                            complete.                           Internal hemorrhoids were found during                            retroflexion. The hemorrhoids were small and Grade                             I (internal hemorrhoids that do not prolapse).                           The exam was otherwise without abnormality on                            direct and retroflexion views. Complications:            No immediate complications. Estimated blood loss:                            None. Estimated Blood Loss:     Estimated blood loss: none. Impression:               - Two 4 to 6 mm polyps at the ileocecal  valve,                            removed with a cold snare. Resected and retrieved.                           - One 10 mm polyp in the transverse colon, removed                            with a cold snare. Resected and retrieved.                           - One 6 mm polyp in the transverse colon, removed                            with a cold snare. Resected and retrieved.                           - One 3 mm polyp in the descending colon, removed                            with a cold biopsy forceps. Resected and retrieved.                           - Internal hemorrhoids.                           - The examination was otherwise normal on direct                            and retroflexion views. Recommendation:           - Repeat colonoscopy in 3 years for surveillance.                           - Patient has a contact number available for                            emergencies. The signs and symptoms of potential                            delayed complications were discussed with the                            patient. Return to normal activities tomorrow.                            Written discharge instructions were provided to the                            patient.                           - Resume previous diet.                           -  Continue present medications.                           - Await pathology results. Ladene Artist, MD 10/21/2018 2:36:15 PM This report has been signed electronically.

## 2018-10-21 NOTE — Progress Notes (Signed)
To PACU, VSS. Report to [email or web address] 

## 2018-10-21 NOTE — Progress Notes (Signed)
Called to room to assist during endoscopic procedure.  Patient ID and intended procedure confirmed with present staff. Received instructions for my participation in the procedure from the performing physician.  

## 2018-10-21 NOTE — Progress Notes (Signed)
Pt's states no medical or surgical changes since previsit or office visit. 

## 2018-10-21 NOTE — Patient Instructions (Signed)
  Await pathology results by mail, approximately 2 weeks.  Resume previous diet and medications and diet today.  Return to normal activities tomorrow.       YOU HAD AN ENDOSCOPIC PROCEDURE TODAY AT [facility]:   Refer to the procedure report that was given to you for any specific questions about what was found during the examination.  If the procedure report does not answer your questions, please call your gastroenterologist to clarify.  If you requested that your care partner not be given the details of your procedure findings, then the procedure report has been included in a sealed envelope for you to review at your convenience later.  YOU SHOULD EXPECT: Some feelings of bloating in the abdomen. Passage of more gas than usual.  Walking can help get rid of the air that was put into your GI tract during the procedure and reduce the bloating. If you had a lower endoscopy (such as a colonoscopy or flexible sigmoidoscopy) you may notice spotting of blood in your stool or on the toilet paper. If you underwent a bowel prep for your procedure, you may not have a normal bowel movement for a few days.  Please Note:  You might notice some irritation and congestion in your nose or some drainage.  This is from the oxygen used during your procedure.  There is no need for concern and it should clear up in a day or so.  SYMPTOMS TO REPORT IMMEDIATELY:   Following lower endoscopy (colonoscopy or flexible sigmoidoscopy):  Excessive amounts of blood in the stool  Significant tenderness or worsening of abdominal pains  Swelling of the abdomen that is new, acute  Fever of 100F or higher    For urgent or emergent issues, a gastroenterologist can be reached at any hour by calling [phone].   DIET:  We do recommend a small meal at first, but then you may proceed to your regular diet.  Drink plenty of fluids but you should avoid alcoholic beverages for 24 hours.  ACTIVITY:  You  should plan to take it easy for the rest of today and you should NOT DRIVE or use heavy machinery until tomorrow (because of the sedation medicines used during the test).    FOLLOW UP: Our staff will call the number listed on your records the next business day following your procedure to check on you and address any questions or concerns that you may have regarding the information given to you following your procedure. If we do not reach you, we will leave a message.  However, if you are feeling well and you are not experiencing any problems, there is no need to return our call.  We will assume that you have returned to your regular daily activities without incident.  If any biopsies were taken you will be contacted by phone or by letter within the next 1-3 weeks.  Please call us at [phone] if you have not heard about the biopsies in 3 weeks.    SIGNATURES/CONFIDENTIALITY: You and/or your care partner have signed paperwork which will be entered into your electronic medical record.  These signatures attest to the fact that that the information above on your After Visit Summary has been reviewed and is understood.  Full responsibility of the confidentiality of this discharge information lies with you and/or your care-partner.

## 2018-10-24 ENCOUNTER — Telehealth: Payer: Self-pay

## 2018-10-24 NOTE — Telephone Encounter (Signed)
  Follow up Call-  Call back number 10/21/2018  Post procedure Call Back phone  # [phone]  Permission to leave phone message Yes  Some recent data might be hidden     Patient questions:  Do you have a fever, pain , or abdominal swelling? No. Pain Score  0 *  Have you tolerated food without any problems? Yes.    Have you been able to return to your normal activities? Yes.    Do you have any questions about your discharge instructions: Diet   No. Medications  No. Follow up visit  No.  Do you have questions or concerns about your Care? No.  Actions: * If pain score is 4 or above: No action needed, pain <4.

## 2018-11-09 ENCOUNTER — Encounter: Payer: Self-pay | Admitting: Gastroenterology

## 2019-11-10 ENCOUNTER — Ambulatory Visit: Payer: Medicare Other | Attending: Internal Medicine

## 2019-11-10 ENCOUNTER — Other Ambulatory Visit: Payer: Self-pay

## 2019-11-10 DIAGNOSIS — Z23 Encounter for immunization: Secondary | ICD-10-CM

## 2019-11-10 NOTE — Progress Notes (Signed)
   Covid-19 Vaccination Clinic  Name:  Phillip Ryan    MRN: [id number] DOB: 1950/12/06  11/10/2019  [name] was observed post Covid-19 immunization for 15 minutes without incidence. He was provided with Vaccine Information Sheet and instruction to access the V-Safe system.   [name] was instructed to call 911 with any severe reactions post vaccine: Marland Kitchen Difficulty breathing  . Swelling of your face and throat  . A fast heartbeat  . A bad rash all over your body  . Dizziness and weakness    Immunizations Administered    Name Date Dose VIS Date Route   Pfizer COVID-19 Vaccine 11/10/2019  3:25 PM 0.3 mL 08/25/2019 Intramuscular   Manufacturer: [place]   Lot: [id number]   [place]: [id number]

## 2019-12-06 ENCOUNTER — Ambulatory Visit: Payer: Medicare Other

## 2020-06-25 ENCOUNTER — Ambulatory Visit (INDEPENDENT_AMBULATORY_CARE_PROVIDER_SITE_OTHER): Payer: Medicare Other | Admitting: Family Medicine

## 2020-06-25 ENCOUNTER — Other Ambulatory Visit: Payer: Self-pay

## 2020-06-25 ENCOUNTER — Encounter: Payer: Self-pay | Admitting: Family Medicine

## 2020-06-25 VITALS — BP 102/68 | HR 62 | Temp 97.2°F | Wt 139.4 lb

## 2020-06-25 DIAGNOSIS — Z23 Encounter for immunization: Secondary | ICD-10-CM

## 2020-06-25 DIAGNOSIS — N529 Male erectile dysfunction, unspecified: Secondary | ICD-10-CM | POA: Diagnosis not present

## 2020-06-25 MED ORDER — TADALAFIL 20 MG PO TABS: 20.0000 mg | ORAL_TABLET | Freq: Every day | ORAL | 0 refills | Status: DC | PRN

## 2020-06-25 NOTE — Progress Notes (Signed)
   Subjective:    Patient ID: Phillip Ryan, male    DOB: [birth year], 69 y.o.   MRN: [id number]  HPI He notes difficulty over the last month with erectile dysfunction.  He has trouble getting and maintaining an erection.  He is on no medications.  He does drink roughly 3 beers per night.  His marriage is going well.   Review of Systems     Objective:   Physical Exam Alert and in no distress otherwise not examined       Assessment & Plan:  Erectile dysfunction, unspecified erectile dysfunction type - Plan: tadalafil (CIALIS) 20 MG tablet  Need for influenza vaccination - Plan: Flu Vaccine QUAD High Dose(Fluad), CANCELED: Flu vaccine HIGH DOSE PF (Fluzone High dose)  Immunization, viral disease - Plan: Pfizer SARS-COV-2 Vaccine I discussed the use of Cialis and possible side effects of the medication.  Explained that he might not need this every time he wants to have sex and if he has difficulty, he will call me.  He was comfortable with that.  Immunizations were updated.

## 2020-08-28 ENCOUNTER — Telehealth: Payer: Self-pay | Admitting: Family Medicine

## 2020-08-28 DIAGNOSIS — N529 Male erectile dysfunction, unspecified: Secondary | ICD-10-CM

## 2020-08-28 MED ORDER — TADALAFIL 20 MG PO TABS: 20.0000 mg | ORAL_TABLET | Freq: Every day | ORAL | 5 refills | Status: DC | PRN

## 2020-08-28 NOTE — Telephone Encounter (Signed)
Pt called for refills of cialis. Please send to [place]. Pt can be reached at [phone]

## 2020-10-08 ENCOUNTER — Ambulatory Visit (INDEPENDENT_AMBULATORY_CARE_PROVIDER_SITE_OTHER): Payer: Medicare Other | Admitting: Family Medicine

## 2020-10-08 ENCOUNTER — Encounter: Payer: Self-pay | Admitting: Family Medicine

## 2020-10-08 VITALS — BP 134/84 | HR 60 | Temp 97.3°F | Ht 67.0 in | Wt 143.2 lb

## 2020-10-08 DIAGNOSIS — N529 Male erectile dysfunction, unspecified: Secondary | ICD-10-CM | POA: Diagnosis not present

## 2020-10-08 DIAGNOSIS — Z9889 Other specified postprocedural states: Secondary | ICD-10-CM | POA: Diagnosis not present

## 2020-10-08 DIAGNOSIS — Z86010 Personal history of colonic polyps: Secondary | ICD-10-CM | POA: Diagnosis not present

## 2020-10-08 MED ORDER — TADALAFIL 20 MG PO TABS: 20.0000 mg | ORAL_TABLET | Freq: Every day | ORAL | 5 refills | Status: DC | PRN

## 2020-10-08 NOTE — Patient Instructions (Signed)
  Phillip Ryan , Thank you for taking time to come for your Medicare Wellness Visit. I appreciate your ongoing commitment to your health goals. Please review the following plan we discussed and let me know if I can assist you in the future.   These are the goals we discussed:  Get the shingles and Tdap This is a list of the screening recommended for you and due dates:  Health Maintenance  Topic Date Due  . Tetanus Vaccine  08/08/2021  . Colon Cancer Screening  10/21/2021  . Flu Shot  Completed  . COVID-19 Vaccine  Completed  .  Hepatitis C: One time screening is recommended by Center for Disease Control  (CDC) for  adults born from [birth year] through 1966.   Completed  . Pneumonia vaccines  Completed

## 2020-10-08 NOTE — Progress Notes (Signed)
Phillip Ryan is a 70 y.o. male who presents for annual wellness visit and follow-up on chronic medical conditions.  He has no particular concerns or complaints.  His immunizations were reviewed and he does need Tdap and shingles.  Continues to use Cialis on an as-needed basis.  He has had previous disc surgery and does have a history of colonic polyps.  He is scheduled for follow-up on his colonic polyps in 2023. He is now retired and enjoying his retirement.  Immunizations and Health Maintenance Immunization History  Administered Date(s) Administered  . Fluad Quad(high Dose 65+) 06/25/2020  . PFIZER(Purple Top)SARS-COV-2 Vaccination 11/10/2019, 12/01/2019, 06/25/2020  . Pneumococcal Conjugate-13 11/21/2014  . Pneumococcal Polysaccharide-23 10/06/2016  . Tdap 08/09/2011   There are no preventive care reminders to display for this patient.  Last colonoscopy: 10/21/18 Last PSA: 10/06/16 Dentist:Q four months Ophtho: Q year Exercise: N/A  Other doctors caring for patient include: [facility],   Advanced Directives: Does Patient Have a Medical Advance Directive?: No Would patient like information on creating a medical advance directive?: Yes (MAU/Ambulatory/Procedural Areas - Information given)  Depression screen:  See questionnaire below.     Depression screen [facility] 2/9 10/08/2020 06/25/2020 09/16/2018 10/06/2016 11/21/2014  Decreased Interest 0 0 0 0 0  Down, Depressed, Hopeless - 0 0 0 0  PHQ - 2 Score 0 0 0 0 0    Fall Screen: See Questionaire below.   Fall Risk  10/08/2020 06/25/2020 09/16/2018 10/06/2016 11/21/2014  Falls in the past year? 0 0 0 No No    ADL screen:  See questionnaire below.  Functional Status Survey: Is the patient deaf or have difficulty hearing?: No Does the patient have difficulty seeing, even when wearing glasses/contacts?: No Does the patient have difficulty concentrating, remembering, or making decisions?: No Does the patient have difficulty walking or  climbing stairs?: No Does the patient have difficulty dressing or bathing?: No Does the patient have difficulty doing errands alone such as visiting a doctor's office or shopping?: No   Review of Systems  Constitutional: -, -unexpected weight change, -anorexia, -fatigue Allergy: -sneezing, -itching, -congestion Dermatology: denies changing moles, rash, lumps ENT: -runny nose, -ear pain, -sore throat,  Cardiology:  -chest pain, -palpitations, -orthopnea, Respiratory: -cough, -shortness of breath, -dyspnea on exertion, -wheezing,  Gastroenterology: -abdominal pain, -nausea, -vomiting, -diarrhea, -constipation, -dysphagia Hematology: -bleeding or bruising problems Musculoskeletal: -arthralgias, -myalgias, -joint swelling, -back pain, - Ophthalmology: -vision changes,  Urology: -dysuria, -difficulty urinating,  -urinary frequency, -urgency, incontinence Neurology: -, -numbness, , -memory loss, -falls, -dizziness    PHYSICAL EXAM:   General Appearance: Alert, cooperative, no distress, appears stated age Head: Normocephalic, without obvious abnormality, atraumatic Eyes: PERRL, conjunctiva/corneas clear, EOM's intact,  Ears: Normal TM's and external ear canals Nose: Nares normal, mucosa normal, no drainage or sinus   tenderness Throat: Lips, mucosa, and tongue normal; teeth and gums normal Neck: Supple, no lymphadenopathy, thyroid:no enlargement/tenderness/nodules; no carotid bruit or JVD Lungs: Clear to auscultation bilaterally without wheezes, rales or ronchi; respirations unlabored Heart: Regular rate and rhythm, S1 and S2 normal, no murmur, rub or gallop Abdomen: Soft, non-tender, nondistended, normoactive bowel sounds, no masses, no hepatosplenomegaly Skin: Skin color, texture, turgor normal, no rashes or lesions Lymph nodes: Cervical, supraclavicular, and axillary nodes normal Neurologic: CNII-XII intact, normal strength, sensation and gait; reflexes 2+ and symmetric throughout    Psych: Normal mood, affect, hygiene and grooming  ASSESSMENT/PLAN: History of colonic polyps  Erectile dysfunction, unspecified erectile dysfunction type - Plan: tadalafil (CIALIS)  20 MG tablet  Immunization recommendations discussed.  Recommended getting shingles and Tdap.  Colonoscopy recommendations reviewed.   Medicare Attestation I have personally reviewed: The patient's medical and social history Their use of alcohol, tobacco or illicit drugs Their current medications and supplements The patient's functional ability including ADLs,fall risks, home safety risks, cognitive, and hearing and visual impairment Diet and physical activities Evidence for depression or mood disorders  The patient's weight, height, and BMI have been recorded in the chart.  I have made referrals, counseling, and provided education to the patient based on review of the above and I have provided the patient with a written personalized care plan for preventive services.     Jill Alexanders, MD   10/08/2020

## 2020-10-09 LAB — CBC WITH DIFFERENTIAL/PLATELET
Basophils Absolute: 0 10*3/uL (ref 0.0–0.2)
Basos: 1 %
EOS (ABSOLUTE): 0.2 10*3/uL (ref 0.0–0.4)
Eos: 4 %
Hematocrit: 45.1 % (ref 37.5–51.0)
Hemoglobin: 14.6 g/dL (ref 13.0–17.7)
Immature Grans (Abs): 0 10*3/uL (ref 0.0–0.1)
Immature Granulocytes: 0 %
Lymphocytes Absolute: 1.9 10*3/uL (ref 0.7–3.1)
Lymphs: 37 %
MCH: 28.1 pg (ref 26.6–33.0)
MCHC: 32.4 g/dL (ref 31.5–35.7)
MCV: 87 fL (ref 79–97)
Monocytes Absolute: 0.5 10*3/uL (ref 0.1–0.9)
Monocytes: 9 %
Neutrophils Absolute: 2.6 10*3/uL (ref 1.4–7.0)
Neutrophils: 49 %
Platelets: 212 10*3/uL (ref 150–450)
RBC: 5.19 x10E6/uL (ref 4.14–5.80)
RDW: 11.2 % — ABNORMAL LOW (ref 11.6–15.4)
WBC: 5.2 10*3/uL (ref 3.4–10.8)

## 2020-10-09 LAB — COMPREHENSIVE METABOLIC PANEL
ALT: 18 IU/L (ref 0–44)
AST: 15 IU/L (ref 0–40)
Albumin/Globulin Ratio: 1.8 (ref 1.2–2.2)
Albumin: 4.2 g/dL (ref 3.7–4.7)
Alkaline Phosphatase: 61 IU/L (ref 44–121)
BUN/Creatinine Ratio: 10 (ref 10–24)
BUN: 11 mg/dL (ref 8–27)
Bilirubin Total: 0.4 mg/dL (ref 0.0–1.2)
CO2: 26 mmol/L (ref 20–29)
Calcium: 9.3 mg/dL (ref 8.6–10.2)
Chloride: 102 mmol/L (ref 96–106)
Creatinine, Ser: 1.13 mg/dL (ref 0.76–1.27)
GFR calc Af Amer: 75 mL/min/{1.73_m2} (ref >59)
GFR calc non Af Amer: 65 mL/min/{1.73_m2} (ref >59)
Globulin, Total: 2.3 g/dL (ref 1.5–4.5)
Glucose: 75 mg/dL (ref 65–99)
Potassium: 4.9 mmol/L (ref 3.5–5.2)
Sodium: 142 mmol/L (ref 134–144)
Total Protein: 6.5 g/dL (ref 6.0–8.5)

## 2021-01-27 ENCOUNTER — Telehealth: Payer: Self-pay | Admitting: Family Medicine

## 2021-01-27 DIAGNOSIS — N529 Male erectile dysfunction, unspecified: Secondary | ICD-10-CM

## 2021-01-27 MED ORDER — TADALAFIL 20 MG PO TABS: 20.0000 mg | ORAL_TABLET | Freq: Every day | ORAL | 5 refills | Status: DC | PRN

## 2021-01-27 NOTE — Telephone Encounter (Signed)
Pt called and is requesting a refill on his tadalafil please send to the [facility] ([place] [place] - [address]

## 2021-08-04 ENCOUNTER — Encounter: Admit: 2021-08-04 | Payer: PRIVATE HEALTH INSURANCE | Primary: Family Medicine

## 2021-08-17 ENCOUNTER — Encounter
Admit: 2021-08-17 | Payer: PRIVATE HEALTH INSURANCE | Attending: Vascular and Interventional Radiology | Primary: Family Medicine

## 2021-08-17 ENCOUNTER — Encounter: Admit: 2021-08-17 | Payer: PRIVATE HEALTH INSURANCE | Primary: Family Medicine

## 2021-08-18 ENCOUNTER — Encounter
Admit: 2021-08-18 | Payer: PRIVATE HEALTH INSURANCE | Attending: Vascular and Interventional Radiology | Primary: Family Medicine

## 2021-08-18 ENCOUNTER — Encounter: Admit: 2021-08-18 | Payer: PRIVATE HEALTH INSURANCE | Primary: Family Medicine

## 2021-08-20 ENCOUNTER — Encounter: Admit: 2021-08-20 | Payer: PRIVATE HEALTH INSURANCE | Primary: Family Medicine

## 2021-08-22 ENCOUNTER — Encounter: Admit: 2021-08-22 | Payer: PRIVATE HEALTH INSURANCE | Primary: Family Medicine

## 2021-08-22 ENCOUNTER — Encounter: Admit: 2021-08-22 | Payer: PRIVATE HEALTH INSURANCE | Attending: Family Medicine | Primary: Family Medicine

## 2021-08-22 DIAGNOSIS — I10 Essential (primary) hypertension: Secondary | ICD-10-CM

## 2021-08-22 DIAGNOSIS — I739 Peripheral vascular disease, unspecified: Secondary | ICD-10-CM

## 2021-08-22 DIAGNOSIS — E119 Type 2 diabetes mellitus without complications: Secondary | ICD-10-CM

## 2021-08-22 DIAGNOSIS — D490 Neoplasm of unspecified behavior of digestive system: Secondary | ICD-10-CM

## 2021-08-22 DIAGNOSIS — E7800 Pure hypercholesterolemia, unspecified: Secondary | ICD-10-CM

## 2021-08-22 MED ORDER — DOCUSATE SODIUM 50 MG CAPSULE
50 mg | Freq: Two times a day (BID) | ORAL | Status: AC
Start: 2021-08-22 — End: 2021-10-24

## 2021-08-22 MED ORDER — ICOSAPENT ETHYL 1 GRAM CAPSULE
1 gram | Freq: Every day | ORAL | Status: AC
Start: 2021-08-22 — End: 2021-09-26

## 2021-08-22 MED ORDER — LATANOPROST 0.005 % EYE DROPS
0.005 % | Freq: Every evening | OPHTHALMIC | Status: AC
Start: 2021-08-22 — End: ?

## 2021-08-22 MED ORDER — OXYCODONE-ACETAMINOPHEN 5 MG-325 MG TABLET
5-325 mg | ORAL | Status: AC | PRN
Start: 2021-08-22 — End: ?

## 2021-08-22 MED ORDER — FAMOTIDINE 40 MG TABLET
40 mg | Freq: Every day | ORAL | Status: AC
Start: 2021-08-22 — End: 2021-12-15

## 2021-08-22 MED ORDER — IRBESARTAN 150 MG-HYDROCHLOROTHIAZIDE 12.5 MG TABLET
Freq: Every day | ORAL | Status: AC
Start: 2021-08-22 — End: 2022-02-03

## 2021-08-22 MED ORDER — ROSUVASTATIN 20 MG TABLET
20 mg | Freq: Every day | ORAL | Status: AC
Start: 2021-08-22 — End: 2021-09-26

## 2021-08-22 MED ORDER — LOSARTAN 100 MG-HYDROCHLOROTHIAZIDE 12.5 MG TABLET
Freq: Every day | ORAL | Status: DC
Start: 2021-08-22 — End: 2021-08-22

## 2021-08-22 MED ORDER — TIMOLOL 0.5 % EYE DROPS
0.5 % | Freq: Two times a day (BID) | OPHTHALMIC | Status: AC
Start: 2021-08-22 — End: ?

## 2021-08-22 MED ORDER — INSULIN LISPRO (U-100) 100 UNIT/ML SUBCUTANEOUS SOLUTION
100 unit/mL | Freq: Three times a day (TID) | SUBCUTANEOUS | Status: AC
Start: 2021-08-22 — End: ?

## 2021-08-22 MED ORDER — TAMSULOSIN 0.4 MG CAPSULE
0.4 mg | Freq: Every day | ORAL | Status: AC
Start: 2021-08-22 — End: ?

## 2021-08-22 MED ORDER — ASPIRIN 81 MG TABLET,DELAYED RELEASE
81 mg | Freq: Every day | ORAL | Status: AC
Start: 2021-08-22 — End: 2021-09-26

## 2021-08-23 LAB — HEMOGLOBIN A1C: Hemoglobin A1C: 4.3

## 2021-08-25 ENCOUNTER — Encounter: Admit: 2021-08-25 | Payer: PRIVATE HEALTH INSURANCE | Attending: Hematology & Oncology | Primary: Family Medicine

## 2021-08-25 ENCOUNTER — Ambulatory Visit: Admit: 2021-08-25 | Payer: MEDICARE | Attending: Hematology & Oncology | Primary: Family Medicine

## 2021-08-25 ENCOUNTER — Inpatient Hospital Stay: Admit: 2021-08-25 | Discharge: 2021-08-25 | Payer: MEDICARE | Primary: Family Medicine

## 2021-08-25 ENCOUNTER — Telehealth: Admit: 2021-08-25 | Payer: PRIVATE HEALTH INSURANCE | Attending: Hematology & Oncology | Primary: Family Medicine

## 2021-08-25 DIAGNOSIS — K769 Liver disease, unspecified: Secondary | ICD-10-CM

## 2021-08-25 DIAGNOSIS — E7800 Pure hypercholesterolemia, unspecified: Secondary | ICD-10-CM

## 2021-08-25 DIAGNOSIS — I739 Peripheral vascular disease, unspecified: Secondary | ICD-10-CM

## 2021-08-25 DIAGNOSIS — E119 Type 2 diabetes mellitus without complications: Secondary | ICD-10-CM

## 2021-08-25 DIAGNOSIS — I10 Essential (primary) hypertension: Secondary | ICD-10-CM

## 2021-08-25 LAB — COMPREHENSIVE METABOLIC PANEL
BKR A/G RATIO: 1.3 (ref 1.0–2.2)
BKR ALANINE AMINOTRANSFERASE (ALT): 59 U/L (ref 9–59)
BKR ALBUMIN: 3.7 g/dL (ref 3.6–4.9)
BKR ALKALINE PHOSPHATASE: 472 U/L — ABNORMAL HIGH (ref 9–122)
BKR ASPARTATE AMINOTRANSFERASE (AST): 69 U/L — ABNORMAL HIGH (ref 10–35)
BKR AST/ALT RATIO: 1.2
BKR BILIRUBIN TOTAL: 1.1 mg/dL (ref <=1.2)
BKR BLOOD UREA NITROGEN: 9 mg/dL (ref 8–23)
BKR BUN / CREAT RATIO: 15.3 (ref 8.0–23.0)
BKR CALCIUM: 9.2 mg/dL (ref 8.8–10.2)
BKR CHLORIDE: 92 mmol/L — ABNORMAL LOW (ref 98–107)
BKR CO2: 31 mmol/L — ABNORMAL HIGH (ref 20–30)
BKR CREATININE: 0.59 mg/dL (ref 0.40–1.30)
BKR EGFR, CREATININE (CKD-EPI 2021): >60 mL/min/{1.73_m2} (ref >=60)
BKR GLOBULIN: 2.9 g/dL (ref 2.3–3.5)
BKR GLUCOSE: 133 mg/dL — ABNORMAL HIGH (ref 70–100)
BKR POTASSIUM: 4.2 mmol/L (ref 3.3–5.1)
BKR PROTEIN TOTAL: 6.6 g/dL (ref 6.6–8.7)
BKR SODIUM: 132 mmol/L — ABNORMAL LOW (ref 136–144)
BKR WAM ABSOLUTE IMMATURE GRANULOCYTES.: 15.3 x 1000/??L (ref 8.0–23.0)
BKR WAM IMMATURE GRANULOCYTES: 92 mmol/L — ABNORMAL LOW (ref 98–107)

## 2021-08-25 LAB — CBC WITH AUTO DIFFERENTIAL
BKR ANION GAP: 8.95 x 1000/??L — ABNORMAL HIGH (ref 2.00–7.60)
BKR WAM ABSOLUTE LYMPHOCYTE COUNT.: 1.25 x 1000/??L — ABNORMAL HIGH (ref 0.60–3.70)
BKR WAM ABSOLUTE NRBC (2 DEC): 0 x 1000/??L (ref 0.00–1.00)
BKR WAM ANALYZER ANC: 8.95 x 1000/ÂµL — ABNORMAL HIGH (ref 2.00–7.60)
BKR WAM BASOPHIL ABSOLUTE COUNT.: 0.07 x 1000/ÂµL (ref 0.00–1.00)
BKR WAM BASOPHILS: 0.6 % (ref 0.0–1.4)
BKR WAM EOSINOPHIL ABSOLUTE COUNT.: 0.1 x 1000/??L (ref 0.00–1.00)
BKR WAM EOSINOPHILS: 0.9 % (ref 0.0–5.0)
BKR WAM HEMATOCRIT (2 DEC): 38.3 % — ABNORMAL LOW (ref 38.50–50.00)
BKR WAM HEMOGLOBIN: 12.8 g/dL — ABNORMAL LOW (ref 13.2–17.1)
BKR WAM LYMPHOCYTES: 10.8 % — ABNORMAL LOW (ref 17.0–50.0)
BKR WAM MCH (PG): 30.7 pg (ref 27.0–33.0)
BKR WAM MCHC: 33.4 g/dL (ref 31.0–36.0)
BKR WAM MCV: 91.8 fL (ref 80.0–100.0)
BKR WAM MONOCYTE ABSOLUTE COUNT.: 1.1 x 1000/??L — ABNORMAL HIGH (ref 0.00–1.00)
BKR WAM MONOCYTES: 9.5 % (ref 4.0–12.0)
BKR WAM MPV: 8.5 fL (ref 8.0–12.0)
BKR WAM NEUTROPHILS: 77.5 % — ABNORMAL HIGH (ref 39.0–72.0)
BKR WAM NUCLEATED RED BLOOD CELLS: 0 % — ABNORMAL HIGH (ref 0.0–1.0)
BKR WAM PLATELETS: 513 x1000/??L — ABNORMAL HIGH (ref 150–420)
BKR WAM RDW-CV: 14.3 % (ref 11.0–15.0)
BKR WAM RED BLOOD CELL COUNT.: 4.17 M/??L (ref 4.00–6.00)
BKR WAM WHITE BLOOD CELL COUNT: 11.6 x1000/??L — ABNORMAL HIGH (ref 4.0–11.0)

## 2021-08-25 NOTE — Telephone Encounter
Upon checkout, according to disposition patient to be scheduled for biopsy and then seen 2 weeks later, informed patient to let us know when that is scheduled and then call us to f/u.

## 2021-08-25 NOTE — Progress Notes
Distress screening tool reviewed by provider. Patient declined intervention.

## 2021-08-26 LAB — PT/INR AND PTT (BH GH L LMW YH)
BKR INR: 1.11 (ref 0.92–1.19)
BKR PARTIAL THROMBOPLASTIN TIME: 27.6 seconds (ref 23.0–31.4)
BKR PROTHROMBIN TIME: 11.5 seconds (ref 9.6–12.3)

## 2021-08-26 NOTE — Progress Notes
Re: Ryan Davies ([birth year])MRN: [id number] Provider: Gevena Davies, MDDate of service: 12/12/2022REFERRING PROVIDER: Alda Davies, MDREASON FOR VISIT: Initial ConsultationDIAGNOSIS:  Liver lesion  (primary encounter diagnosis)HISTORY OF PRESENT ILLNESS:Ryan Davies is a 70 year old gentleman, seen in consultation at the request of Ryan Davies for the evaluation of an abdominal mass with liver mets. The patient has insulin-dependent diabetes and uses a pump. He has peripheral vascular disease and hyperlipidemia. There is no history of coronary or cerebrovascular disease. The patient presents with a 2 month history of increased abdominal fullness, distension, and early satiety. He has had approximately a 25 pound weight loss. He notes discomfort, primarily in the lower abdomen, with associated constipation. There has been no melena or blood per rectum. He denies hemoptysis, hematemesis, or hematuria. Due to his symptoms, he was referred to [facility] [place]. On admission, he had a [place] abdomen/pelvis that revealed a mass in the upper abdomen, displacing the stomach posteriorly. This was heterogeneous with cystic and solid components, as well as calcifications measuring 17 x 16 x 14 cm. The liver contained numerous metastatic lesions which were vascular, measuring up to 7 cm. Spleen, pancreas, adrenals were normal. Kidneys showed bilateral hydronephrosis due to bladder distension. Prostate was not significantly enlarged. GI tract did not show significant bowel distension or masses. The patient was offered evaluation and biopsy which he declined and left the hospital. He notes discomfort in the left upper quadrant. He does not require analgesic pain medication. There is no fevers, chills, night sweats.  Review of Systems Constitutional: Positive for appetite change, fatigue and unexpected weight change. Negative for activity change, chills and fever. HENT: Negative for congestion, ear pain, facial swelling and nosebleeds.  Eyes: Negative for discharge, redness and itching. Respiratory: Negative for cough, chest tightness, shortness of breath and wheezing.       Increased SOB, no c/p.  Minimal cough Cardiovascular: Negative for chest pain, palpitations and leg swelling.      H/o PVD, improved Gastrointestinal: Positive for constipation. Negative for abdominal distention, abdominal pain, anal bleeding, blood in stool, diarrhea, nausea and vomiting.      Colonoscopy 2009-polyps, no f/u.  No blood/thinning.  Early satiety, lower abd pain Endocrine: Negative for cold intolerance and heat intolerance. Genitourinary: Negative for difficulty urinating. Musculoskeletal: Positive for back pain. Negative for neck pain. Allergic/Immunologic: Negative for environmental allergies. Neurological: Negative for dizziness, syncope, speech difficulty, weakness, numbness and headaches. Hematological: Negative for adenopathy. Does not bruise/bleed easily. Psychiatric/Behavioral: Negative for agitation and confusion. The patient is not nervous/anxious.  A comprehensive 12-point review of systems was queried and is otherwise negative.PAST MEDICAL HISTORY: Ryan Davies has a past medical history of Diabetes mellitus (HC Code), Hypercholesteremia, Hypertension, and Peripheral vascular disease (HC Code) (HC CODE) (HC Code).PAST SURGICAL HISTORY: He has a past surgical history that includes Cataract Removal/IOL Implant.ALLERGIES: Environmental allergies MEDICATIONS: ?  aspirin, 81 mg, Oral, Daily?  docusate sodium, , Oral, BID?  famotidine, 40 mg, Oral, Daily?  icosapent ethyL, 2 g, Oral, Daily?  insulin lispro, Inject under the skin 3 (three) times daily before meals.?  irbesartan-hydrochlorothiazide, 1 tablet, Oral, Daily?  latanoprost, 1 drop, Both Eyes, Nightly?  oxyCODONE-acetaminophen, 1 tablet, Oral, Q4H PRN?  rosuvastatin, 20 mg, Oral, Daily?  tamsulosin, 0.4 mg, Oral, Daily?  timoloL, 1 drop, Both Eyes, BIDSOCIAL HISTORY: Ryan Davies  reports that he has been smoking cigarettes. He has a 55.00 pack-year smoking history. He does not have any smokeless tobacco history on file. He reports current alcohol use of about 14.0  standard drinks per week. No history on file for drug use.FAMILY HISTORY: His family history includes Cancer in his brother; Diabetes in his brother and mother; Heart disease in his brother, father, mother, and sister.PHYSICAL EXAM:BP (!) 155/70 (Site: r a, Position: Sitting, Cuff Size: Medium)  - Pulse 89  - Temp 98 ?F (36.7 ?C) (Temporal)  - Resp 20  - Ht 5' 8.9 (1.75 m)  - Wt 69.9 kg  - SpO2 99%  - BMI 22.82 kg/m? Physical ExamConstitutional:     Appearance: He is well-developed and well-nourished. HENT:    Head: Normocephalic and atraumatic.    Mouth/Throat:    Mouth: Oropharynx is clear and moist and mucous membranes are normal. Cardiovascular:    Rate and Rhythm: Normal rate and regular rhythm.    Heart sounds: No murmur heard.Pulmonary:    Breath sounds: Normal breath sounds. No wheezing or rales. Chest: Breasts:   Right: No mass.    Left: No mass. Abdominal:    General: Bowel sounds are normal. There is no distension.    Palpations: Abdomen is soft. There is no mass.    Tenderness: There is no abdominal tenderness. Musculoskeletal:       General: Normal range of motion.    Cervical back: Neck supple. Lymphadenopathy:    Upper Body: No axillary adenopathy present.   Right upper body: No supraclavicular adenopathy.    Left upper body: No supraclavicular adenopathy.    Lower Body: No right inguinal adenopathy. No left inguinal adenopathy. Skin:   General: Skin is warm and dry.    Findings: No ecchymosis, erythema, petechiae or rash. Neurological:    Mental Status: He is alert.    Sensory: No sensory deficit.    Motor: Motor strength is normal. Psychiatric:       Mood and Affect: Mood and affect normal. Data Review:Lab Results Component Value Date  WBC 11.6 (H) 08/25/2021  HGB 12.8 (L) 08/25/2021  HCT 38.30 (L) 08/25/2021  MCV 91.8 08/25/2021  PLT 513 (H) 08/25/2021   Chemistry  Lab Results Component Value Date  NA 132 (L) 08/25/2021  K 4.2 08/25/2021  CL 92 (L) 08/25/2021  CO2 31 (H) 08/25/2021  BUN 9 08/25/2021  CREATININE 0.59 08/25/2021  GLU 133 (H) 08/25/2021  Lab Results Component Value Date  CALCIUM 9.2 08/25/2021  ALKPHOS 472 (H) 08/25/2021  AST 69 (H) 08/25/2021  ALT 59 08/25/2021  BILITOT 1.1 08/25/2021   IMPRESSION and PLAN: Gastric mass: The patient has a large mass in the left upper quadrant with associated liver lesions. This appears to be distinct from the stomach. The presence of calcifications is suggestive of a more chronic process. A GIST tumor or sarcoma is possible. He does not appear to have a lesion involving the colon or small bowel. The patient would benefit from biopsy for histologic confirmation. If he has a GIST tumor, targeted therapy would be an option. PLAN:1. Liver biopsy.2. Return approximately 2 weeks after biopsy to review results and discuss treatment options. 		  Scribed for [facility], MD by Beatrix Fetters, medical scribe December 12, 2022The documentation recorded by the scribe accurately reflects the services I personally performed and the decisions made by me. I reviewed and confirmed all material entered and/or pre-charted by the scribe.

## 2021-09-03 ENCOUNTER — Encounter
Admit: 2021-09-03 | Payer: PRIVATE HEALTH INSURANCE | Attending: Vascular and Interventional Radiology | Primary: Family Medicine

## 2021-09-04 ENCOUNTER — Telehealth: Payer: Self-pay

## 2021-09-04 ENCOUNTER — Other Ambulatory Visit: Payer: Self-pay

## 2021-09-04 DIAGNOSIS — I739 Peripheral vascular disease, unspecified: Secondary | ICD-10-CM

## 2021-09-04 NOTE — Telephone Encounter (Signed)
Nurse is reporting PAD finding in left leg. Pt is stable. Please advise if we need to follow up. [place]

## 2021-09-04 NOTE — Telephone Encounter (Signed)
Ordered. Kh

## 2021-09-10 ENCOUNTER — Telehealth: Admit: 2021-09-10 | Payer: PRIVATE HEALTH INSURANCE | Attending: Hematology & Oncology | Primary: Family Medicine

## 2021-09-10 NOTE — Telephone Encounter
Received call from:  Patient Requesting results for recent test: biopsy Test was completed at: [facility] was completed on: 12/21Best telephone number for call back: [phone] time to return call: anytimePermission to leave message: yes

## 2021-09-12 ENCOUNTER — Encounter: Admit: 2021-09-12 | Payer: PRIVATE HEALTH INSURANCE | Attending: Hematology & Oncology | Primary: Family Medicine

## 2021-09-12 DIAGNOSIS — R1902 Left upper quadrant abdominal swelling, mass and lump: Secondary | ICD-10-CM

## 2021-09-18 ENCOUNTER — Telehealth: Admit: 2021-09-18 | Payer: PRIVATE HEALTH INSURANCE | Attending: Hematology & Oncology | Primary: Family Medicine

## 2021-09-18 NOTE — Telephone Encounter
At home nurse would like to speak to a scheduler reagarding a biopsy  that needs to be scheduled ,she would like a phone number to  schedule this herself ,she was give [phone] [facility].

## 2021-09-23 ENCOUNTER — Telehealth: Admit: 2021-09-23 | Payer: PRIVATE HEALTH INSURANCE | Attending: Hematology & Oncology | Primary: Family Medicine

## 2021-09-23 NOTE — Telephone Encounter
Patient's wife, [name], called requesting to speak to [name] in regards to the patient and the biopsy they are supposed to be getting. [name] noted that they haven't heard anything and would like to know if there is any update on this. [name] can be reached at [phone]

## 2021-09-25 NOTE — Telephone Encounter
Spoke to nancy pt is schedule with dr Daphene Jaeger 1/13

## 2021-09-26 ENCOUNTER — Ambulatory Visit: Admit: 2021-09-26 | Payer: MEDICARE | Attending: Hematology & Oncology | Primary: Family Medicine

## 2021-09-26 ENCOUNTER — Encounter: Admit: 2021-09-26 | Payer: PRIVATE HEALTH INSURANCE | Attending: Hematology & Oncology | Primary: Family Medicine

## 2021-09-26 ENCOUNTER — Inpatient Hospital Stay: Admit: 2021-09-26 | Discharge: 2021-09-26 | Payer: MEDICARE | Primary: Family Medicine

## 2021-09-26 DIAGNOSIS — I739 Peripheral vascular disease, unspecified: Secondary | ICD-10-CM

## 2021-09-26 DIAGNOSIS — C220 Liver cell carcinoma: Secondary | ICD-10-CM

## 2021-09-26 DIAGNOSIS — E7800 Pure hypercholesterolemia, unspecified: Secondary | ICD-10-CM

## 2021-09-26 DIAGNOSIS — E119 Type 2 diabetes mellitus without complications: Secondary | ICD-10-CM

## 2021-09-26 DIAGNOSIS — I10 Essential (primary) hypertension: Secondary | ICD-10-CM

## 2021-09-26 LAB — COMPREHENSIVE METABOLIC PANEL
BKR A/G RATIO: 1.3 x 1000/??L (ref 1.0–2.2)
BKR ALANINE AMINOTRANSFERASE (ALT): 61 U/L — ABNORMAL HIGH (ref 9–59)
BKR ALBUMIN: 3.5 g/dL — ABNORMAL LOW (ref 3.6–4.9)
BKR ALKALINE PHOSPHATASE: 404 U/L — ABNORMAL HIGH (ref 9–122)
BKR ANION GAP: 11 (ref 7–17)
BKR ASPARTATE AMINOTRANSFERASE (AST): 105 U/L — ABNORMAL HIGH (ref 10–35)
BKR AST/ALT RATIO: 1.7 x 1000/??L (ref 0.00–1.00)
BKR BILIRUBIN TOTAL: 1.2 mg/dL (ref 0.0–<=1.2)
BKR BLOOD UREA NITROGEN: 11 mg/dL (ref 8–23)
BKR BUN / CREAT RATIO: 13.9 (ref 8.0–23.0)
BKR CALCIUM: 8.9 mg/dL (ref 8.8–10.2)
BKR CHLORIDE: 96 mmol/L — ABNORMAL LOW (ref 98–107)
BKR CO2: 34 mmol/L — ABNORMAL HIGH (ref 20–30)
BKR CREATININE: 0.79 mg/dL — ABNORMAL HIGH (ref 0.40–1.30)
BKR EGFR, CREATININE (CKD-EPI 2021): >60 mL/min/{1.73_m2} (ref >=60)
BKR GLOBULIN: 2.7 g/dL (ref 2.3–3.5)
BKR GLUCOSE: 93 mg/dL (ref 70–100)
BKR POTASSIUM: 3.5 mmol/L (ref 3.3–5.1)
BKR SODIUM: 141 mmol/L (ref 136–144)
BKR WAM NUCLEATED RED BLOOD CELLS: 105 U/L — ABNORMAL HIGH (ref 10–35)

## 2021-09-26 LAB — CBC WITH AUTO DIFFERENTIAL
BKR PROTEIN TOTAL: 16.4 % — ABNORMAL LOW (ref 17.0–50.0)
BKR WAM ABSOLUTE IMMATURE GRANULOCYTES.: 0.05 x 1000/??L (ref 0.00–0.30)
BKR WAM ABSOLUTE LYMPHOCYTE COUNT.: 1.29 x 1000/ÂµL (ref 0.60–3.70)
BKR WAM ABSOLUTE NRBC (2 DEC): 0 x 1000/??L (ref 0.00–1.00)
BKR WAM ANALYZER ANC: 5.56 x 1000/ÂµL (ref 2.00–7.60)
BKR WAM BASOPHIL ABSOLUTE COUNT.: 0.07 x 1000/??L (ref 0.00–1.00)
BKR WAM BASOPHILS: 0.9 % — ABNORMAL HIGH (ref 0.0–1.4)
BKR WAM EOSINOPHIL ABSOLUTE COUNT.: 0.18 x 1000/??L (ref 0.00–1.00)
BKR WAM EOSINOPHILS: 2.3 % (ref 0.0–5.0)
BKR WAM HEMATOCRIT (2 DEC): 40.5 % (ref 38.50–50.00)
BKR WAM HEMOGLOBIN: 13.1 g/dL — ABNORMAL LOW (ref 13.2–17.1)
BKR WAM IMMATURE GRANULOCYTES: 0.6 % — ABNORMAL HIGH (ref 0.0–1.0)
BKR WAM LYMPHOCYTES: 16.4 % — ABNORMAL LOW (ref 17.0–50.0)
BKR WAM MCH (PG): 29.8 pg (ref 27.0–33.0)
BKR WAM MCHC: 32.3 g/dL (ref 31.0–36.0)
BKR WAM MCV: 92 fL — ABNORMAL HIGH (ref 80.0–100.0)
BKR WAM MONOCYTE ABSOLUTE COUNT.: 0.7 x 1000/ÂµL (ref 0.00–1.00)
BKR WAM MONOCYTES: 8.9 % — ABNORMAL LOW (ref 4.0–12.0)
BKR WAM MPV: 9.1 fL (ref 8.0–12.0)
BKR WAM NEUTROPHILS: 70.9 % (ref 39.0–72.0)
BKR WAM PLATELETS: 449 x1000/??L — ABNORMAL HIGH (ref 150–420)
BKR WAM RDW-CV: 14.6 % (ref 11.0–15.0)
BKR WAM RED BLOOD CELL COUNT.: 4.4 M/??L (ref 4.00–6.00)
BKR WAM WHITE BLOOD CELL COUNT: 7.9 x1000/??L (ref 4.0–11.0)

## 2021-09-26 MED ORDER — LORAZEPAM 0.5 MG TABLET
0.5 mg | Freq: Four times a day (QID) | ORAL | Status: AC | PRN
Start: 2021-09-26 — End: ?

## 2021-09-27 LAB — PT/INR AND PTT (BH GH L LMW YH)
BKR INR: 1.13 ng/mL (ref <9.0)
BKR PARTIAL THROMBOPLASTIN TIME: 27 seconds (ref 23.0–31.4)
BKR PROTHROMBIN TIME: 11.7 seconds (ref 9.6–12.3)

## 2021-09-27 LAB — HEPATITIS C AB WITH REFLEX TO HCV PCR: BKR HEPATITIS C ANTIBODY: NEGATIVE

## 2021-09-27 LAB — HEPATITIS A ANTIBODY, IGM: BKR HEPATITIS A IGM ANTIBODY: NEGATIVE

## 2021-09-27 LAB — HEPATITIS B CORE ANTIBODY, IGM: BKR HEPATITIS B CORE IGM ANTIBODY: NEGATIVE mmol/L — ABNORMAL LOW (ref 98–107)

## 2021-09-27 LAB — HEPATITIS B SURFACE ANTIGEN     (BH GH L LMW YH): BKR HEPATITIS B SURFACE ANTIGEN: NEGATIVE mmol/L — ABNORMAL LOW (ref 3.3–5.1)

## 2021-09-27 NOTE — Progress Notes
Re: Ryan Davies (08-22-1951)MRN: [id number] Provider: Gevena Mart, MDDate of service: 1/13/2023REFERRING PROVIDER: No ref. provider foundREASON FOR VISIT: No chief complaint on file.DIAGNOSIS:  Cancer, hepatocellular (HC Code) (HC CODE) (HC Code)  (primary encounter diagnosis)HISTORY OF PRESENT ILLNESS:Ryan Davies is seen in follow up for the evaluation of a hepatic mass. He had a biopsy of the liver that was positive for hepatocellular carcinoma. The scans were reviewed by Interventional Radiology. The left abdominal mass was felt to be contiguous with the liver and represented local extension rather than a second primary lesion. The patient notes early satiety. His appetite is diminished. He has significant anxiety and chronic insomnia. He has been eating multiple small meals daily. His wife has been outstanding with respect to trying to get calories into him. He has minimal pain. There is no nausea or vomiting. Mild constipation is stable and unchanged.  Review of Systems Constitutional: Positive for appetite change, fatigue and unexpected weight change. Negative for activity change, chills and fever. HENT: Negative for congestion, ear pain, facial swelling and nosebleeds.  Eyes: Negative for discharge, redness and itching. Respiratory: Negative for cough, chest tightness, shortness of breath and wheezing.       Increased SOB, no c/p.  Minimal cough Cardiovascular: Negative for chest pain, palpitations and leg swelling.      H/o PVD, improved Gastrointestinal: Positive for constipation. Negative for abdominal distention, abdominal pain, anal bleeding, blood in stool, diarrhea, nausea and vomiting.      Colonoscopy 2009-polyps, no f/u.  No blood/thinning.  Early satiety, lower abd pain Endocrine: Negative for cold intolerance and heat intolerance. Genitourinary: Negative for difficulty urinating. Musculoskeletal: Positive for back pain. Negative for neck pain. Allergic/Immunologic: Negative for environmental allergies. Neurological: Negative for dizziness, syncope, speech difficulty, weakness, numbness and headaches. Hematological: Negative for adenopathy. Does not bruise/bleed easily. Psychiatric/Behavioral: Negative for agitation and confusion. The patient is not nervous/anxious.  A comprehensive 12-point review of systems was queried and is otherwise negative.PAST MEDICAL HISTORY: Ryan Davies has a past medical history of Diabetes mellitus (HC Code), Hypercholesteremia, Hypertension, and Peripheral vascular disease (HC Code) (HC CODE) (HC Code).PAST SURGICAL HISTORY: He has a past surgical history that includes Cataract Removal/IOL Implant.ALLERGIES: Environmental allergies MEDICATIONS: ?  docusate sodium, , Oral, BID?  famotidine, 40 mg, Oral, Daily?  insulin lispro, Inject under the skin 3 (three) times daily before meals.?  irbesartan-hydrochlorothiazide, 1 tablet, Oral, Daily?  latanoprost, 1 drop, Both Eyes, Nightly?  LORazepam, 0.5 mg, Oral, Q6H PRN?  oxyCODONE-acetaminophen, 1 tablet, Oral, Q4H PRN?  tamsulosin, 0.4 mg, Oral, Daily?  timoloL, 1 drop, Both Eyes, BIDSOCIAL HISTORY: Ryan Davies  reports that he has been smoking cigarettes. He has a 55.00 pack-year smoking history. He does not have any smokeless tobacco history on file. He reports current alcohol use of about 14.0 standard drinks per week. No history on file for drug use.FAMILY HISTORY: His family history includes Cancer in his brother; Diabetes in his brother and mother; Heart disease in his brother, father, mother, and sister.PHYSICAL EXAM:BP (!) 162/80 (Site: r a, Position: Sitting, Cuff Size: Medium)  - Pulse 84  - Temp 97 ?F (36.1 ?C) (Temporal)  - Resp 20  - Ht 5' 8.11 (1.73 m)  - Wt 68.7 kg  - SpO2 (!) 92%  - BMI 22.95 kg/m? Physical ExamConstitutional:     Appearance: He is ill-appearing. HENT:    Head: Normocephalic and atraumatic. Cardiovascular:    Rate and Rhythm: Normal rate and regular rhythm.    Heart sounds: No  murmur heard.Pulmonary:    Breath sounds: Normal breath sounds. No wheezing or rales. Chest: Breasts:   Right: No mass.    Left: No mass. Abdominal:    General: Bowel sounds are normal. There is no distension.    Palpations: Abdomen is soft. There is no mass.    Tenderness: There is no abdominal tenderness. Musculoskeletal:       General: Normal range of motion.    Cervical back: Neck supple. Lymphadenopathy:    Upper Body:    Right upper body: No supraclavicular adenopathy.    Left upper body: No supraclavicular adenopathy.    Lower Body: No right inguinal adenopathy. No left inguinal adenopathy. Skin:   General: Skin is warm and dry.    Findings: No ecchymosis, erythema, petechiae or rash. Neurological:    Mental Status: He is alert.    Sensory: No sensory deficit. Data Review:Lab Results Component Value Date  WBC 7.9 09/26/2021  HGB 13.1 (L) 09/26/2021  HCT 40.50 09/26/2021  MCV 92.0 09/26/2021  PLT 449 (H) 09/26/2021   Chemistry  Lab Results Component Value Date  NA 141 09/26/2021  K 3.5 09/26/2021  CL 96 (L) 09/26/2021  CO2 34 (H) 09/26/2021  BUN 11 09/26/2021  CREATININE 0.79 09/26/2021  GLU 93 09/26/2021  Lab Results Component Value Date  CALCIUM 8.9 09/26/2021  ALKPHOS 404 (H) 09/26/2021  AST 105 (H) 09/26/2021  ALT 61 (H) 09/26/2021  BILITOT 1.2 09/26/2021  Component    Latest Ref Rng 09/26/2021 Hepatitis B Surface Antigen    Negative  Negative  Hepatitis B Core IgM Antibody    Negative  Negative  Hepatitis A Antibody, IgM    Negative  Negative  Hepatitis C Antibody    Negative  Negative  Component    Latest Ref Rng 09/26/2021 Prothrombin Time    9.6 - 12.3 seconds 11.7  INR    0.92 - 1.19  1.13  PTT    23.0 - 31.4 seconds 27.0  AFP Pending12/12/2020 [place] [facility] vascular in nature margins 7 centimetersComplex mass-upper abdomen displacing stomach anteriorly.  Cystic/solid components with calcification.  17 x 16 x 14 centimeters.  This is separate from the liverSpleen/pancreas/adrenals normalRetroperitoneum-no adenopathyImpression 17 centimeter upper abdominal mass displaces stomach posteriorly.  Separate from the liver.  Consider just tumor.  Innumerable hepatic lesions consistent with metastases IMPRESSION and PLAN: Hepatocellular carcinoma: The patient has locally advanced hepatocellular carcinoma. The left upper quadrant abdominal mass represents extension of his primary disease.He has normal hepatic function.  Hepatitis studies were negative.  AFP is pending. Given the extent of his disease and it?s symptomatic nature, he would benefit from treatment. Standard therapy would be atezolizumab with bevacizumab. However, in discussion with Dr Kathryne Gin, he is potentially a candidate for clinical trials looking at adding additional medication to standard therapy. We reviewed the status of his disease as well as treatment options. I would recommend consultation with Dr Kathryne Gin, and they are agreeable. An appointment request has been made.PLAN:1. Consult Dr Meredith Mody.2. Followup appointment, 6 weeks. This can be canceled if he is being actively followed by Dr Meredith Mody.  Scribed for [facility]len Labs, MD by Beatrix Fetters, medical scribe January 14, 2023The documentation recorded by the scribe accurately reflects the services I personally performed and the decisions made by me. I reviewed and confirmed all material entered and/or pre-charted by the scribe.

## 2021-09-30 ENCOUNTER — Encounter: Admit: 2021-09-30 | Payer: PRIVATE HEALTH INSURANCE | Attending: Family | Primary: Family Medicine

## 2021-09-30 DIAGNOSIS — C220 Liver cell carcinoma: Secondary | ICD-10-CM

## 2021-09-30 LAB — ALPHAFETOPROTEIN, TUMOR MARKER: BKR AFP-TUMOR MARKER (YH): 1 ng/mL (ref <9.0)

## 2021-10-03 ENCOUNTER — Inpatient Hospital Stay: Admit: 2021-10-03 | Discharge: 2021-10-03 | Payer: MEDICARE | Primary: Family Medicine

## 2021-10-03 ENCOUNTER — Encounter: Admit: 2021-10-03 | Payer: PRIVATE HEALTH INSURANCE | Attending: Hematology & Oncology | Primary: Family Medicine

## 2021-10-06 ENCOUNTER — Encounter: Admit: 2021-10-06 | Payer: PRIVATE HEALTH INSURANCE | Attending: Family | Primary: Family Medicine

## 2021-10-06 DIAGNOSIS — C220 Liver cell carcinoma: Secondary | ICD-10-CM

## 2021-10-08 ENCOUNTER — Encounter: Admit: 2021-10-08 | Payer: PRIVATE HEALTH INSURANCE | Primary: Family Medicine

## 2021-10-08 ENCOUNTER — Encounter: Admit: 2021-10-08 | Payer: PRIVATE HEALTH INSURANCE | Attending: Hematology & Oncology | Primary: Family Medicine

## 2021-10-08 ENCOUNTER — Inpatient Hospital Stay: Admit: 2021-10-08 | Discharge: 2021-10-08 | Payer: MEDICARE | Primary: Family Medicine

## 2021-10-08 ENCOUNTER — Ambulatory Visit: Admit: 2021-10-08 | Payer: MEDICARE | Attending: Hematology & Oncology | Primary: Family Medicine

## 2021-10-08 DIAGNOSIS — F17210 Nicotine dependence, cigarettes, uncomplicated: Secondary | ICD-10-CM

## 2021-10-08 DIAGNOSIS — Z006 Encounter for examination for normal comparison and control in clinical research program: Secondary | ICD-10-CM

## 2021-10-08 DIAGNOSIS — E7800 Pure hypercholesterolemia, unspecified: Secondary | ICD-10-CM

## 2021-10-08 DIAGNOSIS — Z794 Long term (current) use of insulin: Secondary | ICD-10-CM

## 2021-10-08 DIAGNOSIS — I739 Peripheral vascular disease, unspecified: Secondary | ICD-10-CM

## 2021-10-08 DIAGNOSIS — E119 Type 2 diabetes mellitus without complications: Secondary | ICD-10-CM

## 2021-10-08 DIAGNOSIS — Z79899 Other long term (current) drug therapy: Secondary | ICD-10-CM

## 2021-10-08 DIAGNOSIS — I10 Essential (primary) hypertension: Secondary | ICD-10-CM

## 2021-10-08 DIAGNOSIS — C220 Liver cell carcinoma: Secondary | ICD-10-CM

## 2021-10-08 DIAGNOSIS — Z809 Family history of malignant neoplasm, unspecified: Secondary | ICD-10-CM

## 2021-10-08 LAB — COMPREHENSIVE METABOLIC PANEL
BKR A/G RATIO: 1.3 (ref 1.0–2.2)
BKR ALANINE AMINOTRANSFERASE (ALT): 69 U/L — ABNORMAL HIGH (ref 9–59)
BKR ALBUMIN: 3.8 g/dL (ref 3.6–4.9)
BKR ALKALINE PHOSPHATASE: 490 U/L — ABNORMAL HIGH (ref 9–122)
BKR ANION GAP: 13 g/dL (ref 7–17)
BKR ASPARTATE AMINOTRANSFERASE (AST): 118 U/L — ABNORMAL HIGH (ref 10–35)
BKR AST/ALT RATIO: 1.7
BKR BILIRUBIN TOTAL: 1.3 mg/dL — ABNORMAL HIGH (ref <=1.2)
BKR BLOOD UREA NITROGEN: 13 mg/dL — ABNORMAL HIGH (ref 8–23)
BKR BUN / CREAT RATIO: 15.3 % — ABNORMAL LOW (ref 8.0–23.0)
BKR CALCIUM: 9.3 mg/dL (ref 8.8–10.2)
BKR CHLORIDE: 94 mmol/L — ABNORMAL LOW (ref 98–107)
BKR CO2: 29 mmol/L (ref 20–30)
BKR CREATININE: 0.85 mg/dL (ref 0.40–1.30)
BKR EGFR, CREATININE (CKD-EPI 2021): >60 mL/min/1.73m2 (ref >=60–1.00)
BKR GLOBULIN: 3 g/dL (ref 2.3–3.5)
BKR POTASSIUM: 4 mmol/L (ref 3.3–5.3)
BKR PROTEIN TOTAL: 6.8 g/dL (ref 6.6–8.7)
BKR SODIUM: 136 mmol/L (ref 136–144)

## 2021-10-08 LAB — CBC WITH AUTO DIFFERENTIAL
BKR GLUCOSE: 15 % — ABNORMAL HIGH (ref 11.0–15.0)
BKR WAM ABSOLUTE IMMATURE GRANULOCYTES.: 0.03 x 1000/??L (ref 0.00–0.30)
BKR WAM ABSOLUTE LYMPHOCYTE COUNT.: 1.29 x 1000/??L (ref 0.60–3.70)
BKR WAM ABSOLUTE NRBC (2 DEC): 0 x 1000/??L (ref 0.00–1.00)
BKR WAM ANALYZER ANC: 6.27 x 1000/??L (ref 2.00–7.60)
BKR WAM BASOPHIL ABSOLUTE COUNT.: 0.05 x 1000/??L (ref 0.00–1.00)
BKR WAM BASOPHILS: 0.6 % — ABNORMAL HIGH (ref 0.0–1.4)
BKR WAM EOSINOPHIL ABSOLUTE COUNT.: 0.08 x 1000/??L (ref 0.00–1.00)
BKR WAM EOSINOPHILS: 1 % (ref 0.0–5.0)
BKR WAM HEMATOCRIT (2 DEC): 43.8 % (ref 38.50–50.00)
BKR WAM HEMOGLOBIN: 14.2 g/dL (ref 13.2–17.1)
BKR WAM IMMATURE GRANULOCYTES: 0.4 % (ref 0.0–1.0)
BKR WAM LYMPHOCYTES: 15.4 % — ABNORMAL LOW (ref 17.0–50.0)
BKR WAM MCH (PG): 30.1 pg (ref 27.0–33.0)
BKR WAM MCHC: 32.4 g/dL (ref 31.0–36.0)
BKR WAM MCV: 92.8 fL — ABNORMAL LOW (ref 80.0–100.0)
BKR WAM MONOCYTE ABSOLUTE COUNT.: 0.64 x 1000/??L (ref 0.00–1.00)
BKR WAM MONOCYTES: 7.7 % (ref 4.0–12.0)
BKR WAM MPV: 9.3 fL (ref 8.0–12.0)
BKR WAM NEUTROPHILS: 74.9 % — ABNORMAL HIGH (ref 39.0–72.0)
BKR WAM NUCLEATED RED BLOOD CELLS: 0 % (ref 0.0–1.0)
BKR WAM PLATELETS: 506 x1000/ÂµL — ABNORMAL HIGH (ref 150–420)
BKR WAM RDW-CV: 15 % (ref 11.0–15.0)
BKR WAM RED BLOOD CELL COUNT.: 4.72 M/??L (ref 4.00–6.00)
BKR WAM WHITE BLOOD CELL COUNT: 8.4 x1000/??L (ref 4.0–11.0)

## 2021-10-08 MED ORDER — ESCITALOPRAM 10 MG TABLET
10 mg | Freq: Every day | ORAL | Status: AC
Start: 2021-10-08 — End: ?

## 2021-10-09 ENCOUNTER — Other Ambulatory Visit: Payer: Self-pay

## 2021-10-09 ENCOUNTER — Ambulatory Visit (INDEPENDENT_AMBULATORY_CARE_PROVIDER_SITE_OTHER): Payer: Medicare Other | Admitting: Family Medicine

## 2021-10-09 ENCOUNTER — Encounter: Payer: Self-pay | Admitting: Family Medicine

## 2021-10-09 ENCOUNTER — Encounter: Admit: 2021-10-09 | Payer: PRIVATE HEALTH INSURANCE | Primary: Family Medicine

## 2021-10-09 VITALS — BP 110/68 | HR 49 | Temp 97.4°F | Ht 67.0 in | Wt 128.0 lb

## 2021-10-09 DIAGNOSIS — Z0000 Encounter for general adult medical examination without abnormal findings: Secondary | ICD-10-CM

## 2021-10-09 DIAGNOSIS — Z86010 Personal history of colonic polyps: Secondary | ICD-10-CM | POA: Diagnosis not present

## 2021-10-09 DIAGNOSIS — Z23 Encounter for immunization: Secondary | ICD-10-CM

## 2021-10-09 DIAGNOSIS — Z9889 Other specified postprocedural states: Secondary | ICD-10-CM | POA: Diagnosis not present

## 2021-10-09 DIAGNOSIS — N529 Male erectile dysfunction, unspecified: Secondary | ICD-10-CM | POA: Diagnosis not present

## 2021-10-09 LAB — ALPHAFETOPROTEIN, TUMOR MARKER: BKR AFP-TUMOR MARKER (YH): 1 ng/mL (ref <9.0)

## 2021-10-09 MED ORDER — TADALAFIL 20 MG PO TABS: 20.0000 mg | ORAL_TABLET | Freq: Every day | ORAL | 5 refills | Status: DC | PRN

## 2021-10-09 NOTE — Patient Instructions (Signed)
°  Mr. Phillip Ryan , Thank you for taking time to come for your Medicare Wellness Visit. I appreciate your ongoing commitment to your health goals. Please review the following plan we discussed and let me know if I can assist you in the future.   These are the goals we discussed: Cut back on alcohol to 1 or 2 beers per day   This is a list of the screening recommended for you and due dates:  Health Maintenance  Topic Date Due   Zoster (Shingles) Vaccine (1 of 2) Never done   COVID-19 Vaccine (4 - Booster for Pfizer series) 08/20/2020   Flu Shot  04/14/2021   Tetanus Vaccine  08/08/2021   Colon Cancer Screening  10/21/2021   Pneumonia Vaccine  Completed   Hepatitis C Screening: USPSTF Recommendation to screen - Ages 18-79 yo.  Completed   HPV Vaccine  Aged Out

## 2021-10-09 NOTE — Progress Notes (Signed)
Phillip Ryan is a 71 y.o. male who presents for annual wellness visit ,CPE and follow-up on chronic medical conditions.  He has no particular concerns or questions.  He does have a history of colonic polyps and should be scheduled to be seen soon for follow-up colonoscopy.  He would like a refill on his Cialis.  He was recently seen for home visit and a hemoglobin A1c was done which was 4.3.  He apparently did order hepatitis C however 1 was already done.  He does drink between 15 and 20 beers per week.  His medical and social history as well as health maintenance and immunizations was reviewed.   Immunizations and Health Maintenance Immunization History  Administered Date(s) Administered   Fluad Quad(high Dose 65+) 06/25/2020   PFIZER(Purple Top)SARS-COV-2 Vaccination 11/10/2019, 12/01/2019, 06/25/2020   Pneumococcal Conjugate-13 11/21/2014   Pneumococcal Polysaccharide-23 10/06/2016   Tdap 08/09/2011   Health Maintenance Due  Topic Date Due   Zoster Vaccines- Shingrix (1 of 2) Never done   COVID-19 Vaccine (4 - Booster for Pfizer series) 08/20/2020   INFLUENZA VACCINE  04/14/2021   TETANUS/TDAP  08/08/2021    Last colonoscopy:10/21/18  Dr. Fuller Plan  Last PSA: 10/06/16 (2.3 ) Dentist:q six months  Ophtho: Q year Exercise: N/A  Other doctors caring for patient include: Dr. Fuller Plan GI            Dr. Burt Knack dentist                     Dr. Rosana Hoes eye  Advanced Directives: Does Patient Have a Medical Advance Directive?: No Would patient like information on creating a medical advance directive?: Yes (ED - Information included in AVS) Info given  Depression screen:  See questionnaire below.     Depression screen [facility] 2/9 10/09/2021 10/08/2020 06/25/2020 09/16/2018 10/06/2016  Decreased Interest 0 0 0 0 0  Down, Depressed, Hopeless 0 - 0 0 0  PHQ - 2 Score 0 0 0 0 0    Fall Screen: See Questionaire below.   Fall Risk  10/09/2021 10/08/2020 06/25/2020 09/16/2018 10/06/2016  Falls in the past  year? 0 0 0 0 No  Number falls in past yr: 1 - - - -  Injury with Fall? 0 - - - -  Risk for fall due to : No Fall Risks - - - -  Follow up Falls evaluation completed - - - -    ADL screen:  See questionnaire below.  Functional Status Survey: Is the patient deaf or have difficulty hearing?: No Does the patient have difficulty seeing, even when wearing glasses/contacts?: No Does the patient have difficulty concentrating, remembering, or making decisions?: No Does the patient have difficulty walking or climbing stairs?: No Does the patient have difficulty dressing or bathing?: No Does the patient have difficulty doing errands alone such as visiting a doctor's office or shopping?: No   Review of Systems  Constitutional: -, -unexpected weight change, -anorexia, -fatigue Allergy: -sneezing, -itching, -congestion Dermatology: denies changing moles, rash, lumps ENT: -runny nose, -ear pain, -sore throat,  Cardiology:  -chest pain, -palpitations, -orthopnea, Respiratory: -cough, -shortness of breath, -dyspnea on exertion, -wheezing,  Gastroenterology: -abdominal pain, -nausea, -vomiting, -diarrhea, -constipation, -dysphagia Hematology: -bleeding or bruising problems Musculoskeletal: -arthralgias, -myalgias, -joint swelling, -back pain, - Ophthalmology: -vision changes,  Urology: -dysuria, -difficulty urinating,  -urinary frequency, -urgency, incontinence Neurology: -, -numbness, , -memory loss, -falls, -dizziness PHYSICAL EXAM: General Appearance: Alert, cooperative, no distress, appears stated age Head: Normocephalic, without obvious  abnormality, atraumatic Eyes: PERRL, conjunctiva/corneas clear, EOM's intact,  Ears: Normal TM's and external ear canals Nose: Nares normal, mucosa normal, no drainage or sinus   tenderness Throat: Lips, mucosa, and tongue normal; teeth and gums normal Neck: Supple, no lymphadenopathy, thyroid:no enlargement/tenderness/nodules; no carotid bruit or  JVD Lungs: Clear to auscultation bilaterally without wheezes, rales or ronchi; respirations unlabored Heart: Regular rate and rhythm, S1 and S2 normal, no murmur, rub or gallop Abdomen: Soft, non-tender, nondistended, normoactive bowel sounds, no masses, no hepatosplenomegaly Skin: Skin color, texture, turgor normal, no rashes or lesions Lymph nodes: Cervical, supraclavicular, and axillary nodes normal Neurologic: CNII-XII intact, normal strength, sensation and gait; reflexes 2+ and symmetric throughout   Psych: Normal mood, affect, hygiene and grooming  ASSESSMENT/PLAN: Routine general medical examination at a health care facility  Erectile dysfunction, unspecified erectile dysfunction type - Plan: tadalafil (CIALIS) 20 MG tablet  History of colonic polyps - Plan: Ambulatory referral to Gastroenterology  Need for influenza vaccination - Plan: Flu Vaccine QUAD High Dose(Fluad)  Immunization, viral disease - Plan: Ambulance person Booster  Hx of cervical discectomy He is to get Shingrix and Tdap at [facility]. Discussed cutting back on alcohol consumption to 1 or 2 beverages per day. Discussed PSA screening (risks/benefits),     Medicare Attestation I have personally reviewed: The patient's medical and social history Their use of alcohol, tobacco or illicit drugs Their current medications and supplements The patient's functional ability including ADLs,fall risks, home safety risks, cognitive, and hearing and visual impairment Diet and physical activities Evidence for depression or mood disorders  The patient's weight, height, and BMI have been recorded in the chart.  I have made referrals, counseling, and provided education to the patient based on review of the above and I have provided the patient with a written personalized care plan for preventive services.     Jill Alexanders, MD   10/09/2021

## 2021-10-09 NOTE — Progress Notes
View : No data to display.    Referral coordinator attempted to contact the pt regarding [place] supportive services. Pt didn't answer, left a VM for pt to return call.

## 2021-10-10 ENCOUNTER — Encounter: Admit: 2021-10-10 | Payer: PRIVATE HEALTH INSURANCE | Primary: Family Medicine

## 2021-10-10 DIAGNOSIS — C801 Malignant (primary) neoplasm, unspecified: Secondary | ICD-10-CM

## 2021-10-10 NOTE — Progress Notes
View : No data to display.    	Referral coordinator contacted the pt regarding [place] supportive services. Pt is interested in Integrative medicine for massages and reike to help him cope with stress.

## 2021-10-11 NOTE — Research Notes
HIC#: [id number] Title: [id number]: A PHASE Ib/II, OPEN-LABEL, MULTICENTER, RANDOMIZED UMBRELLA STUDY EVALUATING THE EFFICACY AND SAFETY OF MULTIPLE IMMUNOTHERAPY-BASED TREATMENT COMBINATIONS IN PATIENTS WITH ADVANCED LIVER CANCER (MORPHEUS-LIVER)Subject Name: Ryan Davies: N/AMedical Record Number: [id number] Title of Informed Consent: COMPOUND AUTHORIZATION AND CONSENT FOR PARTICIPATION IN A RESEARCH PROJECTConsent Type: Initial YCC/IRB Approval Date: 04-MAY-2022Protocol Version: 4Protocol Date: 18-Nov-2020 Informed Consent obtained byInvestigator: Kathryne Gin, MDOther authorized personnel: Marolyn Haller, [facility]         Date of signed written informed consent: 08-Oct-2021 The above trial was presented in full to the participant.  The following topics were presented and discussed: -Study Title-Principal Investigator and Contact Information-Invitation to Participate and Description of Study-Purpose-Study Procedures and Randomization-Treatment Period-End of Treatment-Follow Up Period-Potential Risks, Side Effects, Discomforts, and Inconveniences-Methods of contraception-Benefits-Economic Considerations-Treatment Alternatives-Confidentiality and Authorization to collect, use, and disclose Protected Health Information-Voluntary Participation and [facility] Research-Optional Specimens The participant was given ample time to review the document and ask and receive answers to questions by the investigator.  The participant verbalized understanding of the above information and stated all their questions were answered to their satisfaction. The consent was willingly signed by the participant/LAR, and a signed copy was provided to the participant to retain for their personal records. The participant agrees to comply with study and follow-up procedures. The Principal Investigator's contact information was provided to the participant. The informed consent was signed prior to initiating any study-related procedures.  Optional Consents:YES - Consent for Optional Collection of Samples for the Research Biosample Repository During the study - Patient signed this optional consentVisit Comment: Ryan Davies and his wife presented to clinic on 10/08/2021 to meet with Kathryne Gin, MD and Marolyn Haller, CRC to sign consent for the trial mentioned above. The consent was reviewed with the subject in its entirety, and he was given ample time to read the informed consent form.  All questions were answered to his and his wife?s satisfaction and the consent form was signed in the presence of Kathryne Gin, MD and Marolyn Haller, CRC.  The subject was given a copy of the entire signed consent form for his records.

## 2021-10-14 ENCOUNTER — Encounter: Admit: 2021-10-14 | Payer: PRIVATE HEALTH INSURANCE | Primary: Family Medicine

## 2021-10-14 ENCOUNTER — Inpatient Hospital Stay: Admit: 2021-10-14 | Discharge: 2021-10-14 | Payer: MEDICARE | Primary: Family Medicine

## 2021-10-14 ENCOUNTER — Telehealth: Admit: 2021-10-14 | Payer: PRIVATE HEALTH INSURANCE | Attending: Hematology & Oncology | Primary: Family Medicine

## 2021-10-14 DIAGNOSIS — J90 Pleural effusion, not elsewhere classified: Secondary | ICD-10-CM

## 2021-10-14 DIAGNOSIS — C220 Liver cell carcinoma: Secondary | ICD-10-CM

## 2021-10-14 DIAGNOSIS — Z006 Encounter for examination for normal comparison and control in clinical research program: Secondary | ICD-10-CM

## 2021-10-14 DIAGNOSIS — C801 Malignant (primary) neoplasm, unspecified: Secondary | ICD-10-CM

## 2021-10-14 LAB — HIV-1/HIV-2 ANTIBODY/ANTIGEN SCREEN W/REFLEX     (BH GH LMW YH): BKR HIV 1 AND 2 ANTIBODY/HIV-1 ANTIGEN SCREEN: NEGATIVE

## 2021-10-14 LAB — HEPATITIS C AB WITH REFLEX TO HCV PCR: BKR HEPATITIS C ANTIBODY: NEGATIVE

## 2021-10-14 LAB — HEPATITIS B SURFACE ANTIBODY     (BH GH L LMW YH): BKR HEP B SURFACE AB INITIAL RESULT: <8 m[IU]/mL (ref >=12)

## 2021-10-14 LAB — HEPATITIS B SURFACE ANTIGEN     (BH GH L LMW YH): BKR HEPATITIS B SURFACE ANTIGEN: NEGATIVE

## 2021-10-14 MED ORDER — IOHEXOL 350 MG IODINE/ML INTRAVENOUS SOLUTION
350 mg iodine/mL | Freq: Once | INTRAVENOUS | Status: CP | PRN
Start: 2021-10-14 — End: ?
  Administered 2021-10-14: 18:00:00 350 mL via INTRAVENOUS

## 2021-10-14 MED ORDER — SODIUM CHLORIDE 0.9 % LARGE VOLUME SYRINGE FOR AUTOINJECTOR
Freq: Once | INTRAVENOUS | Status: CP | PRN
Start: 2021-10-14 — End: ?
  Administered 2021-10-14: 18:00:00 via INTRAVENOUS

## 2021-10-14 NOTE — Progress Notes
Patient presented to lab for EKG and labs.Research tubes not provided; Ryan Davies contacted.Will draw research tubes at another time. EKG completed and labs drawn.Patient to go to [place] for scan. Discharged in stable condition.

## 2021-10-14 NOTE — Telephone Encounter
 Lab called stated that Quantiferon test was cancelled due to quantity not sufficiant

## 2021-10-15 ENCOUNTER — Telehealth: Admit: 2021-10-15 | Payer: PRIVATE HEALTH INSURANCE | Attending: Registered" | Primary: Family Medicine

## 2021-10-15 ENCOUNTER — Ambulatory Visit: Admit: 2021-10-15 | Payer: PRIVATE HEALTH INSURANCE | Attending: Registered" | Primary: Family Medicine

## 2021-10-15 ENCOUNTER — Encounter: Admit: 2021-10-15 | Payer: PRIVATE HEALTH INSURANCE | Attending: Family | Primary: Family Medicine

## 2021-10-15 NOTE — Progress Notes
Spoke with Ryan Davies in regards to the orange alert from his [name] of the chest done yesterday which showed new moderate bilateral pleural effusions. He reports feeling well, with some very mild occasional SOB only with strenuous exertion, and not bothersome to him. No chest pain, cough, palpitations or any other symptoms. He was advised to continue to monitor SOB and call if it worsens. He is agreeable to this plan.

## 2021-10-15 NOTE — Progress Notes
Oncology Nutrition Assessment Location of visit: [facility] Type of visit:Initial Assessment - telephone Reason for consult: Dx HCC. Pt to start treatment on clinical trial with Dr. Meredith Mody. Please call to discuss ways to optimize nutrition.Referred by: Victorino Dike, RNPast Medical History: DM, hypercholesterolemia, HTNNutritionally relevant medications/supplements: reviewedNutritionally relevant labs: reviewed labs from 1/25Treatment: to start clinical trial HIC# [id number] Height: 69Weight: 158#/71.759kgBMI:  23.43IBW:  72.6 ([place])Usual body weight: 170# about 6 months ago per ptWeight changes: Wt Readings from Last 10 Encounters: 10/08/21 71.8 kg 09/26/21 68.7 kg 08/25/21 69.9 kg Weight History Assessment:  Per Epic records wt is up 7# (4.6%) over 12 day period between 1/13-1/25.  Noting current wt is down 12# (7.1%)  from reported UBW over a 6 month period which is not significant for the time frame. Nutrition Focused Physical Exam: Unable to assess as spoke with pt via telephoen Estimated Nutrition Needs: 2152 kcal (30kcal/kg) 86-108 g protein (1.2-1.5g/kg) Based on CA with treatment, dosing wt=71.759kgDiet history: Typical intake reviewed; pt's diet appears insufficient in protein and calories, lacking fruits and vegetablesBreakfast:  1 egg, 1/2 slice of toast with peanut butter and grape jellySnack:  Today was 1/2 butter pecan ice creamLunch:  Yesterday had [employer] - 2 pieces of chicken.  Plans to have leftovers from [facility] today chicken and possibly mashed potatoes with gravySnack:  Baked cheetosDinner:  1 slice of toast with peanut butter and jellyBeverages:  Mostly drinks water Cultural/religious considerations: None notedLifestyle/support system: wife supportiveActivity level/physical limitations: ambulatoryFood allergies/intolerances: none noted  Nutrition Assessment: Chart reviewed for initial/ assessment. Pt is receiving treatment for HCC.  Spoke with pt via telephone.  Introduced self and explained role.  He reports his appetite is good; however, experiences early satiety.  He does eat small frequent meals.  He dislikes ONS.  He does report having some swelling in legs which may be reason for wt gain between 1/13-1/25.  He reports does not ad salt to food and wife cooks with minimal salt.     All nutrition questions answered at this time. Nutrition Diagnosis: Unintentional weight loss   related to early satiety as evidenced by wt is down 12# (7.1%)  from reported UBW over a 6 month periodNutrition Intervention: Modify distribution, type, or amount of food and nutrients, Specific foods/beverages or groups and Recommended modifications               ?	Reviewed possible nutrition-related side effects of treatment and dietary strategies for managing early satiety.  Encouraged adding extra high calorie items such as extra olive oil, avocado, etc.?	Encouraged continued small, frequent meals/day and encouraged bedtime snackGoals by next nutrition visit:1.  Pt will consume 5-6 smaller more frequent meals/snacks per day Education Materials provided: Practical ideas for increasing calories and protein Nutrition Monitoring and Evaluation: Amount of food and Weight Collaboration with other providers: as needed Level of Motivation: GoodBarriers to Change: None notedLevel of Understanding: pt verbalized understanding of aboveExpected compliance:  expect good compliance as symptoms allowFollow up as needed or upon consultLength of time spent with pt: 15 mins Provided contact information and encouraged pt to call if any additional questions or issues arise.Seen XB:MWUXLK Jules Husbands, MS, RD, CNSCCell [phone] Signed by Dellia Cloud, RD, October 15, 2021

## 2021-10-15 NOTE — Telephone Encounter
Consult received.  Call placed to pt.  Spoke with spouse who reports pt not available to at this time and took contact information for him to back at a more convenient time.Dellia Cloud, MS, RD, CNSCElectronically Signed by Dellia Cloud, RD, October 15, 2021

## 2021-10-16 ENCOUNTER — Inpatient Hospital Stay: Admit: 2021-10-16 | Discharge: 2021-10-16 | Payer: MEDICARE | Primary: Family Medicine

## 2021-10-16 ENCOUNTER — Encounter: Admit: 2021-10-16 | Payer: PRIVATE HEALTH INSURANCE | Primary: Family Medicine

## 2021-10-16 DIAGNOSIS — C220 Liver cell carcinoma: Secondary | ICD-10-CM

## 2021-10-20 NOTE — Other
2/6 2:30 PM Per order liver biopsy needed prior to clinical trial. approvd US guided liver mass biopsy, provider choice. try to go through normal liver. will need trial instructions SC. Case reviewed and approved by Dr. Doran Heater. Diamantina Providence, RN

## 2021-10-21 ENCOUNTER — Encounter: Admit: 2021-10-21 | Payer: PRIVATE HEALTH INSURANCE | Attending: Gerontology | Primary: Family Medicine

## 2021-10-21 ENCOUNTER — Encounter: Admit: 2021-10-21 | Payer: PRIVATE HEALTH INSURANCE | Primary: Family Medicine

## 2021-10-21 DIAGNOSIS — Z01818 Encounter for other preprocedural examination: Secondary | ICD-10-CM

## 2021-10-21 NOTE — Other
Ryan Cunningham71 y.o.Male, [birth year]Last Weight: 71.8 kgPhone: [phone]: Clinical Trial InstructionsReceived: Olivia Canter, NP; Diamantina Providence, RN; Solon Palm, RN; Philis Nettle, Reed Pandy, Please see below the Biopsy Instructions: Trial: [facility] [id number] Interventional Radiology, IR US GUIDED LIVER BIOPSY Date: Tentative 10/24/2021 at 11:00AM Patient: Ryan Davies [id number] Instructions: 1. ?3-5 cores (5 preferably) aligned and embedded into a single block 2. ?Surface area of at least 5 x 5 mm (25 mm2) 3. ?Sample volume of 1 mm3, total depth of at least 40 microns 4. ?Tumor sample contains at least 30,000 cells but preferably 75,000 to 150,000 cells with at least 80% of cells being nucleated. 5. ?Sample has at least 20% of cells being malignant cells. [facility] personnel will not be present at the biopsy. This sample should go through pathology and be managed by CTTS. Thank you, Melissa   ? Previous Messages?----- Message ----- From: Buddy Duty, NP Sent: 10/20/2021 ? 2:41 PM EST To: Diamantina Providence, RN, Marolyn Haller Subject: RE: Clinical Trial Instructions ? ? ? ? ? ? ? Including Melissa from the trial to give any specific instructions ----- Message ----- From: Diamantina Providence, RN Sent: 10/20/2021 ? 2:39 PM EST To: Buddy Duty, NP Subject: Clinical Trial Instructions ? ? ? ? ? ? ? ? ? Hi Katelyn, We will need the specific trial instructions sent to [place] for this patient's biopsy with IR. Thank you. Hoover Browns, RN 10/21/2021 7:48 AM

## 2021-10-22 ENCOUNTER — Inpatient Hospital Stay: Admit: 2021-10-22 | Discharge: 2021-10-22 | Payer: MEDICARE | Primary: Family Medicine

## 2021-10-22 ENCOUNTER — Encounter: Admit: 2021-10-22 | Payer: PRIVATE HEALTH INSURANCE | Attending: Adult Health | Primary: Family Medicine

## 2021-10-22 DIAGNOSIS — Z01812 Encounter for preprocedural laboratory examination: Secondary | ICD-10-CM

## 2021-10-22 DIAGNOSIS — Z01818 Encounter for other preprocedural examination: Secondary | ICD-10-CM

## 2021-10-22 DIAGNOSIS — Z20822 Contact with and (suspected) exposure to covid-19: Secondary | ICD-10-CM

## 2021-10-22 LAB — COVID-19 CLEARANCE OR FOR PLACEMENT ONLY: BKR SARS-COV-2 RNA (COVID-19) (YH): NOT DETECTED

## 2021-10-23 ENCOUNTER — Encounter: Admit: 2021-10-23 | Payer: PRIVATE HEALTH INSURANCE | Attending: Adult Health | Primary: Family Medicine

## 2021-10-23 DIAGNOSIS — Z01818 Encounter for other preprocedural examination: Secondary | ICD-10-CM

## 2021-10-24 ENCOUNTER — Encounter: Admit: 2021-10-24 | Payer: PRIVATE HEALTH INSURANCE | Attending: Internal Medicine | Primary: Family Medicine

## 2021-10-24 ENCOUNTER — Inpatient Hospital Stay: Admit: 2021-10-24 | Discharge: 2021-10-24 | Payer: MEDICARE | Attending: Diagnostic Radiology | Primary: Family Medicine

## 2021-10-24 DIAGNOSIS — C220 Liver cell carcinoma: Secondary | ICD-10-CM

## 2021-10-24 DIAGNOSIS — I739 Peripheral vascular disease, unspecified: Secondary | ICD-10-CM

## 2021-10-24 DIAGNOSIS — E7800 Pure hypercholesterolemia, unspecified: Secondary | ICD-10-CM

## 2021-10-24 DIAGNOSIS — E119 Type 2 diabetes mellitus without complications: Secondary | ICD-10-CM

## 2021-10-24 DIAGNOSIS — I10 Essential (primary) hypertension: Secondary | ICD-10-CM

## 2021-10-24 MED ORDER — MIDAZOLAM 1 MG/ML INJECTION SOLUTION
1 mg/mL | Status: CP
Start: 2021-10-24 — End: ?

## 2021-10-24 MED ORDER — SODIUM CHLORIDE 0.9 % (FLUSH) INJECTION SYRINGE
0.9 % | Freq: Three times a day (TID) | INTRAVENOUS | Status: DC
Start: 2021-10-24 — End: 2021-10-25
  Administered 2021-10-24: 19:00:00 0.9 mL via INTRAVENOUS

## 2021-10-24 MED ORDER — FENTANYL (PF) 50 MCG/ML INJECTION SOLUTION
50 mcg/mL | Status: CP
Start: 2021-10-24 — End: ?

## 2021-10-24 MED ORDER — SODIUM CHLORIDE 0.9 % (FLUSH) INJECTION SYRINGE
0.9 % | INTRAVENOUS | Status: DC | PRN
Start: 2021-10-24 — End: 2021-10-25

## 2021-10-24 NOTE — Plan of Care
Problem: Procedural Patient Plan of CareGoal: Plan of Care ReviewOutcome: Interventions implemented as appropriateGoal: Patient-Specific Goal (Individualized)Outcome: Interventions implemented as appropriateGoal: Absence of Hospital-Acquired Illness or InjuryOutcome: Interventions implemented as appropriateGoal: Optimal Comfort and WellbeingOutcome: Interventions implemented as appropriateGoal: Readiness for Transition of CareOutcome: Interventions implemented as appropriateGoal: Recovery from Sedation EffectsOutcome: Interventions implemented as appropriate Plan of Care Overview/ Patient Status

## 2021-10-24 NOTE — Telephone Encounter
First attempt call made to conduct pre-procedure call for EGD on 10/30/2021 at [facility]. Contact Ryan Davies, wife and patient spokesperson; Patient is currently procedure liver biopsy 10/24/2021. Left initial voice message to please return the call to the GI Navigation Department.

## 2021-10-24 NOTE — Procedures
[facility] NotePatient name: Ryan Davies MRN: [id number] Date of procedure: 2/10/2023Procedure(s): Percutaneous US-guided liver lesion biopsySedation method: Moderate Sedation. Patient reassessed immediately prior to the procedure before sedation administered.Pre Procedure Diagnosis: HCCPost Procedure Diagnosis: SameIndications: SamePrimary Attending: Cherlyn Labella, MDAssistant: Gabriel Carina, MDAnesthesia: NoneSpecimen(s) removed: None - unless specified belowAdditional studies ordered: None - unless specified belowDrains: None - unless specified belowEstimated Blood Loss: MinimalComplications: None - unless specified belowVascular closure method: N/AFindings/Notes/Comments: Percutaneous US-guided biopsy of a right hepatic lobe lesion with samples preserved in formalin and saline.Verbal Orders and Supervisory Attestation: As the procedural physician, I verify I was present throughout the entire procedure and provided physician orders as documented in the specialty electronic documentation system. Furthermore I directly supervised all non-physicians providers who assisted me duing the procedure and I accept delegation of supervisory responsibility for all post procedure patient care activity related to this patient that I request from a [facility] PA/APRN/staff member.Sherlyn Hay Tanee Henery, MD2/06/2022

## 2021-10-24 NOTE — Discharge Instructions
Interventional Radiology StandardPOST PROCEDURE DISCHARGE INSTRUCTIONSSite Care:1.  Remove dressing after 48 hours (2 days).2.  Keep wound clean and dry.3.  May shower after 72 hours (3 days).4.  No swimming, hot tubs or tub bath for 10 days.Activity:For the next 24 hours do not drive, consume alcohol, make any legal decisions, operate heavy machinery or perform strenuous activity.Diet:Resume diet prior to procedure as tolerated. Good nutrition promotes healing.Medications:Resume all previous medications without change.Call your doctor if you have:Temperature greater than 100.4?FPersistent nausea and vomitingSevere uncontrolled painRedness, tenderness, or signs of infection (pain, swelling, redness, odor or green/yellow discharge around the site)Difficulty breathing, headache or changes in vision.HivesPersistent dizziness or light-headednessExtreme fatigue/tirednessAny other questions or concerns you may have after dischargeIn an emergency, call 911 or go to an Emergency Department at a nearby hospital.It is important to bring a complete, current list of your medications (including over the counter medications and supplements) to any medical appointments or hospitalizations.REMINDER: Carry a list of your medications and allergies with you at all timesCall your pharmacy at least 1 week in advance to refill prescriptionsCONTACT INFORMATIONIf you have any questions or problems, please call: Interventional Radiology at [phone] ([phone])

## 2021-10-24 NOTE — Anesthesia Procedure Notes
History & Physical Attestation and Pre-Procedural Moderate Sedation Assessment History & Physical Attestation: This is a non-emergent procedure. I have completed (or reviewed and attested to) a History and Physical Exam written within the last 30 days, which is in the patient record. The patient has been assessed and examined prior to this procedure and no changes were noted unless otherwise documented here: NonePhysician Airway Assessment:Normal: As the licensed practitioner certified in moderate sedation and based on my review, immediately prior to this procedure, of the airway evaluation and other pertinent documentation in the patient's record, this patient is a suitable candidate for moderate sedation during the planned procedure.Procedural Sedation Risk Assessment:Additional risks, if any, related to procedural sedation are noted here: None

## 2021-10-24 NOTE — Other
[facility] Interventional RadiologyHistory of Present Illness Ryan Davies is a 71 y.o. male with HCC, who presents for US-guided liver mass biopsy.Past Medical History Past Medical History: Diagnosis Date ? Diabetes mellitus (HC Code)  ? Hypercholesteremia  ? Hypertension  ? Peripheral vascular disease (HC Code) (HC CODE) (HC Code)   Past Surgical History Past Surgical History: Procedure Laterality Date ? CATARACT REMOVAL/IOL IMPLANT    Family History Family History Problem Relation Age of Onset ? Heart disease Mother  ? Diabetes Mother  ? Heart disease Father  ? Heart disease Sister  ? Heart disease Brother  ? Diabetes Brother  ? Cancer Brother   Social History Social History Tobacco Use ? Smoking status: Every Day   Packs/day: 1.00   Years: 55.00   Pack years: 55.00   Types: Cigarettes ? Smokeless tobacco: Not on file Substance Use Topics ? Alcohol use: Yes   Alcohol/week: 14.0 standard drinks   Types: 14 Glasses of wine per week   Comment: social  He has no history on file for drug use.   Medications  No current facility-administered medications on file prior to encounter. Current Outpatient Medications on File Prior to Encounter Medication Sig Dispense Refill ? docusate sodium (COLACE) 50 mg capsule Take by mouth 2 (two) times daily. (Patient not taking: Reported on 10/08/2021)   ? escitalopram oxalate (LEXAPRO) 10 mg tablet Take 1 tablet (10 mg total) by mouth daily.   ? famotidine (PEPCID) 40 mg tablet Take 1 tablet (40 mg total) by mouth daily.   ? insulin lispro (ADMELOG, HUMALOG) 100 unit/mL vial Inject under the skin 3 (three) times daily before meals.   ? irbesartan-hydrochlorothiazide (AVALIDE) 150-12.5 mg per tablet Take 1 tablet by mouth daily.   ? latanoprost (XALATAN) 0.005 % ophthalmic solution Place 1 drop into the left eye nightly.   ? LORazepam (ATIVAN) 0.5 mg tablet Take 1 tablet (0.5 mg total) by mouth every 6 (six) hours as needed for anxiety.   ? oxyCODONE-acetaminophen (PERCOCET) 5-325 mg per tablet Take 1 tablet by mouth every 4 (four) hours as needed for pain. (Patient not taking: Reported on 10/08/2021)   ? tamsulosin (FLOMAX) 0.4 mg 24 hr capsule Take 1 capsule (0.4 mg total) by mouth daily.   ? timoloL (BETIMOL) 0.5 % ophthalmic solution Place 1 drop into the left eye 2 (two) times daily.     Allergies Allergies Allergen Reactions ? Environmental Allergies Sneezing  Objective Data Vital SignsThere were no vitals filed for this visit.Physical ExamNeuro: Awake and alert in no acute distressHeart: Warm, perfusedLungs: No respiratory distressAbdomen: Non-distendedLaboratory ResultsChemistry:No results for input(s): NA, K, CL, CO2, BUN, CREATININE in the last 168 hours.Complete Blood Count:No results for input(s): WBC, HGB, HCT, PLT in the last 168 hours.Liver Function Tests:No results for input(s): AST, ALT, ALKPHOS, BILITOT in the last 168 hours.Coagulation Studies:No results for input(s): PTT, LABPROT, INR in the last 168 hours.Microbiology:No results for input(s): LABBLOO, LABURIN, LOWERRESPIRA in the last 168 hours.Assessment and Plan AssessmentJohnnie Davies is a 71 y.o. male with HCC, who presents for US-guided liver mass biopsy.Plan- Proceed with US-guided liver mass biopsy.Please text or call my Mobile Heart Beat with any questions or concerns.With urgent questions or concerns, please contact IR AV:[id number] (pagers)- YSC: [phone]- SRC: [phone]- BH: [phone] (phone, all locations)- [phone] Sandi Mealy, MD 10/24/2021

## 2021-10-27 ENCOUNTER — Encounter: Admit: 2021-10-27 | Payer: PRIVATE HEALTH INSURANCE | Primary: Family Medicine

## 2021-10-27 ENCOUNTER — Encounter: Admit: 2021-10-27 | Payer: PRIVATE HEALTH INSURANCE | Attending: Internal Medicine | Primary: Family Medicine

## 2021-10-27 DIAGNOSIS — E119 Type 2 diabetes mellitus without complications: Secondary | ICD-10-CM

## 2021-10-27 DIAGNOSIS — I739 Peripheral vascular disease, unspecified: Secondary | ICD-10-CM

## 2021-10-27 DIAGNOSIS — C220 Liver cell carcinoma: Secondary | ICD-10-CM

## 2021-10-27 DIAGNOSIS — E7800 Pure hypercholesterolemia, unspecified: Secondary | ICD-10-CM

## 2021-10-27 DIAGNOSIS — I10 Essential (primary) hypertension: Secondary | ICD-10-CM

## 2021-10-27 MED ORDER — ALBUTEROL SULFATE HFA 90 MCG/ACTUATION AEROSOL INHALER
90 mcg/actuation | Status: AC
Start: 2021-10-27 — End: ?

## 2021-10-27 MED ORDER — ROSUVASTATIN 10 MG TABLET
10 mg | ORAL | Status: AC
Start: 2021-10-27 — End: 2021-10-30

## 2021-10-27 MED ORDER — MULTIVITAMIN CAPSULE
Freq: Every day | ORAL | Status: AC
Start: 2021-10-27 — End: ?

## 2021-10-27 MED ORDER — VASCEPA 0.5 GRAM CAPSULE
0.5 gram | ORAL | Status: AC
Start: 2021-10-27 — End: 2021-10-30

## 2021-10-27 MED ORDER — COENZYME Q10 10 MG CAPSULE
10 mg | Freq: Every day | ORAL | Status: AC
Start: 2021-10-27 — End: ?

## 2021-10-27 MED ORDER — TIMOLOL MALEATE 0.5 % EYE DROPS
0.5 % | Status: AC
Start: 2021-10-27 — End: 2021-10-30

## 2021-10-27 NOTE — Other
Patient is scheduled for EGD on 10/30/2021 at [facility]. Venetia Maxon wife and patient spokesperson performed Pre-procedure call. Discharge transportation policy & NPO instructions given per pre-procedure guidelines. Patient instructed to take Albuterol prn, Timolol opthal solu, escitalopram with sip of water on the morning of scheduled procedure. Patient scheduled & will perform pre-procedure COVID testing from hospital. Teach back method used. Bernice Mullin wife verbalizes understanding.Patient confirms history of diabetes. Patient provided with instructions per the [facility] Pre-procedure Diabetes Protocol. Instructed patient to take call Endocrinologist for instructions on Insulin Pump Humalog prior to procedure. Patient instructed do not take any Humalog insulin prior to procedure. Instructed patient for BS less than 70 mg/dL take apple juice and recheck BS in 15 minutes. If BS less than 70 mg/dL, repeat apple juice for BS is less than 70 mg/dL. Patient instructed to report last apple juice taken to anesthesia prior to procedure.  Teach back method used. Renley Banwart wife verbalizes understanding.

## 2021-10-27 NOTE — Progress Notes
SOCIAL WORK NOTEPatient Name: Kaikoa Magro Record Number: [id number] Date of Birth: 1952/11/23Social Work Non-Billing Follow Up  [employer] Most Recent Value Admission Information  Document Type Progress Note Reason for Current Social Work Involvement Support/Coping Source of Information Audiological scientist Comment Mr. Leona Carry wife [name] Record Reviewed Yes Level of Care Ambulatory High risk psychosocial issues requiring intervention Support Psychosocial interventions 15 minutes spent via telephone with Mr. Leona Carry wife [name] to provide support and explore needs at request of treatment team. [name] expressed Mr. Koren Bound was not available. I introduced myself, social work role and explained the reason for my call. [name] expressed at this time they have no psychosocial needs. I provided my contact information to [place] for follow-up as needed. Collaborations Treatment team Next Steps/Plan (including hand-off): No further social work intervention indicated at this time. Please re-consult as needed. Signature: Alger Memos, LMSW Contact Information: [phone]

## 2021-10-28 ENCOUNTER — Inpatient Hospital Stay: Admit: 2021-10-28 | Discharge: 2021-10-28 | Payer: MEDICARE | Primary: Family Medicine

## 2021-10-28 DIAGNOSIS — Z01818 Encounter for other preprocedural examination: Secondary | ICD-10-CM

## 2021-10-28 DIAGNOSIS — Z006 Encounter for examination for normal comparison and control in clinical research program: Secondary | ICD-10-CM

## 2021-10-28 DIAGNOSIS — Z20822 Contact with and (suspected) exposure to covid-19: Secondary | ICD-10-CM

## 2021-10-28 DIAGNOSIS — Z01812 Encounter for preprocedural laboratory examination: Secondary | ICD-10-CM

## 2021-10-29 ENCOUNTER — Ambulatory Visit: Admit: 2021-10-29 | Payer: MEDICARE | Attending: Anesthesiology | Primary: Family Medicine

## 2021-10-29 DIAGNOSIS — C220 Liver cell carcinoma: Secondary | ICD-10-CM

## 2021-10-29 LAB — COVID-19 CLEARANCE OR FOR PLACEMENT ONLY: BKR SARS-COV-2 RNA (COVID-19) (YH): NOT DETECTED

## 2021-10-30 ENCOUNTER — Ambulatory Visit: Admit: 2021-10-30 | Payer: MEDICARE | Attending: Anesthesiology | Primary: Family Medicine

## 2021-10-30 ENCOUNTER — Inpatient Hospital Stay: Admit: 2021-10-30 | Discharge: 2021-10-30 | Payer: MEDICARE | Attending: Internal Medicine | Primary: Family Medicine

## 2021-10-30 DIAGNOSIS — K449 Diaphragmatic hernia without obstruction or gangrene: Secondary | ICD-10-CM

## 2021-10-30 DIAGNOSIS — M1990 Unspecified osteoarthritis, unspecified site: Secondary | ICD-10-CM

## 2021-10-30 DIAGNOSIS — Z794 Long term (current) use of insulin: Secondary | ICD-10-CM

## 2021-10-30 DIAGNOSIS — K2100 Gastro-esophageal reflux disease with esophagitis, without bleeding: Secondary | ICD-10-CM

## 2021-10-30 DIAGNOSIS — I340 Nonrheumatic mitral (valve) insufficiency: Secondary | ICD-10-CM

## 2021-10-30 DIAGNOSIS — Z79899 Other long term (current) drug therapy: Secondary | ICD-10-CM

## 2021-10-30 DIAGNOSIS — I10 Essential (primary) hypertension: Secondary | ICD-10-CM

## 2021-10-30 DIAGNOSIS — E7800 Pure hypercholesterolemia, unspecified: Secondary | ICD-10-CM

## 2021-10-30 DIAGNOSIS — E1051 Type 1 diabetes mellitus with diabetic peripheral angiopathy without gangrene: Secondary | ICD-10-CM

## 2021-10-30 DIAGNOSIS — Z87891 Personal history of nicotine dependence: Secondary | ICD-10-CM

## 2021-10-30 DIAGNOSIS — Z9109 Other allergy status, other than to drugs and biological substances: Secondary | ICD-10-CM

## 2021-10-30 DIAGNOSIS — C220 Liver cell carcinoma: Secondary | ICD-10-CM

## 2021-10-30 DIAGNOSIS — Z006 Encounter for examination for normal comparison and control in clinical research program: Secondary | ICD-10-CM

## 2021-10-30 MED ORDER — LACTATED RINGERS INTRAVENOUS SOLUTION
INTRAVENOUS | Status: DC | PRN
Start: 2021-10-30 — End: 2021-10-30
  Administered 2021-10-30: 14:00:00 via INTRAVENOUS

## 2021-10-30 MED ORDER — FENTANYL (PF) 50 MCG/ML INJECTION SOLUTION
50 mcg/mL | INTRAVENOUS | Status: DC | PRN
Start: 2021-10-30 — End: 2021-10-30
  Administered 2021-10-30: 14:00:00 50 mcg/mL via INTRAVENOUS

## 2021-10-30 MED ORDER — LACTATED RINGERS INTRAVENOUS SOLUTION
INTRAVENOUS | Status: DC
Start: 2021-10-30 — End: 2021-10-30

## 2021-10-30 MED ORDER — FENTANYL (PF) 50 MCG/ML INJECTION SOLUTION
50 mcg/mL | Status: CP
Start: 2021-10-30 — End: ?

## 2021-10-30 MED ORDER — SIMETHICONE 40 MG/0.6 ML ORAL DROPS,SUSPENSION
400.6 mg/0.6 mL | Status: DC | PRN
Start: 2021-10-30 — End: 2021-10-30

## 2021-10-30 MED ORDER — SODIUM CHLORIDE 0.9 % (FLUSH) INJECTION SYRINGE
0.9 % | Freq: Three times a day (TID) | INTRAVENOUS | Status: DC
Start: 2021-10-30 — End: 2021-10-30

## 2021-10-30 MED ORDER — PROPOFOL 10 MG/ML INTRAVENOUS EMULSION
10 mg/mL | INTRAVENOUS | Status: DC | PRN
Start: 2021-10-30 — End: 2021-10-30
  Administered 2021-10-30: 14:00:00 10 mL/h via INTRAVENOUS

## 2021-10-30 MED ORDER — ONDANSETRON HCL (PF) 4 MG/2 ML INJECTION SOLUTION
42 mg/2 mL | INTRAVENOUS | Status: DC | PRN
Start: 2021-10-30 — End: 2021-10-30

## 2021-10-30 MED ORDER — PROPOFOL 10 MG/ML INTRAVENOUS EMULSION
10 mg/mL | INTRAVENOUS | Status: DC | PRN
Start: 2021-10-30 — End: 2021-10-30
  Administered 2021-10-30: 14:00:00 10 mg/mL via INTRAVENOUS

## 2021-10-30 MED ORDER — PHENYLEPHRINE 1 MG/10 ML (100 MCG/ML) IN 0.9 % SOD.CHLORIDE IV SYRINGE
1 mg/0 mL (00 mcg/mL) | INTRAVENOUS | Status: DC | PRN
Start: 2021-10-30 — End: 2021-10-30
  Administered 2021-10-30: 14:00:00 1 mg/0 mL (00 mcg/mL) via INTRAVENOUS

## 2021-10-30 MED ORDER — SODIUM CHLORIDE 0.9 % (FLUSH) INJECTION SYRINGE
0.9 % | INTRAVENOUS | Status: DC | PRN
Start: 2021-10-30 — End: 2021-10-30

## 2021-10-30 MED ORDER — SODIUM CHLORIDE 0.9 % INTRAVENOUS SOLUTION
INTRAVENOUS | Status: DC
Start: 2021-10-30 — End: 2021-10-30

## 2021-10-30 MED ORDER — LIDOCAINE (PF) 20 MG/ML (2 %) INJECTION SOLUTION
20 mg/mL (2 %) | INTRAVENOUS | Status: DC | PRN
Start: 2021-10-30 — End: 2021-10-30
  Administered 2021-10-30: 14:00:00 20 mg/mL (2 %) via INTRAVENOUS

## 2021-10-30 NOTE — Other
Post Anesthesia Transfer of Care NotePatient: Ryan CunninghamProcedure(s) Performed: Procedure(s) (LRB):EGD (N/A) Patient location: PACU Last Vitals: Vitals Value Taken Time BP 97/43 10/30/21 0916 Temp 36.5 ?C 10/30/21 0915 Pulse 61 10/30/21 0916 Resp 16 10/30/21 0916 SpO2 100 % 10/30/21 0916 Vitals shown include unvalidated device data.Level of consciousness: awakeTransport Vital Signs:  Stable since the last set of recorded intra-operative vital signsIntra-operative Complications: noneIntra-operative Intake & Output and Antibiotics as per Anesthesia record and discussed with the RN.

## 2021-10-30 NOTE — Anesthesia Post-Procedure Evaluation
Anesthesia Post-op NotePatient: Ryan CunninghamProcedure(s):  Procedure(s) (LRB):EGD (N/A) Patient location: PACULast Vitals:  I have noted the vital signs as listed in the nursing notes.Mental status recovered: patient participates in evaluation: YesVital signs reviewed: YesRespiratory function stable:YesAirway is patent: YesCardiovascular function and hydration status stable: YesPain control satisfactory: YesNausea and vomiting control satisfactory:YesNo notable events documented.

## 2021-10-30 NOTE — H&P
[facility]	 [facility]	Gastroenterology History & PhysicalHPI: Surveillance for esophageal varicesMedical History: PMH PSH Past Medical History: Diagnosis Date ? Diabetes mellitus (HC Code)  ? Hepatocellular carcinoma (HC Code) (HC CODE) (HC Code) 10/06/2021 ? Hypercholesteremia  ? Hypertension  ? Peripheral vascular disease (HC Code) (HC CODE) (HC Code)   Past Surgical History: Procedure Laterality Date ? CATARACT REMOVAL/IOL IMPLANT  07/05/2021 ? EVISCERATION WITH ACRYLIC IMPLANT  07/22/2021 ? LIVER BIOPSY  10/24/2021  Social History Family History Social History Tobacco Use ? Smoking status: Former   Packs/day: 1.00   Years: 55.00   Pack years: 55.00   Types: Cigarettes   Quit date: 2018   Years since quitting: 5.1 ? Smokeless tobacco: Current ? Tobacco comments:   Uses vape nicotine  Substance Use Topics ? Alcohol use: Yes   Alcohol/week: 14.0 standard drinks   Types: 14 Glasses of wine per week   Comment: social  Family History Problem Relation Age of Onset ? Heart disease Mother  ? Diabetes Mother  ? Heart disease Father  ? Heart disease Sister  ? Heart disease Brother  ? Diabetes Brother  ? Cancer Brother   Prior to Admission Medications Medications Prior to Admission Medication Sig Dispense Refill Last Dose ? albuterol sulfate 90 mcg/actuation HFA aerosol inhaler     ? coenzyme Q10 10 mg capsule Take 1 capsule (10 mg total) by mouth daily.    ? escitalopram oxalate (LEXAPRO) 10 mg tablet Take 1 tablet (10 mg total) by mouth daily.    ? famotidine (PEPCID) 40 mg tablet Take 1 tablet (40 mg total) by mouth daily.    ? insulin lispro (ADMELOG, HUMALOG) 100 unit/mL vial Inject under the skin 3 (three) times daily before meals.    ? irbesartan-hydrochlorothiazide (AVALIDE) 150-12.5 mg per tablet Take 1 tablet by mouth daily.    ? latanoprost (XALATAN) 0.005 % ophthalmic solution Place 1 drop into the left eye nightly.    ? LORazepam (ATIVAN) 0.5 mg tablet Take 1 tablet (0.5 mg total) by mouth every 6 (six) hours as needed for anxiety.    ? multivitamin capsule Take 1 capsule by mouth daily.    ? oxyCODONE-acetaminophen (PERCOCET) 5-325 mg per tablet Take 1 tablet by mouth every 4 (four) hours as needed for pain.    ? rosuvastatin (CRESTOR) 10 mg tablet Take 1 tablet (10 mg total) by mouth.    ? tamsulosin (FLOMAX) 0.4 mg 24 hr capsule Take 1 capsule (0.4 mg total) by mouth daily.    ? timoloL (BETIMOL) 0.5 % ophthalmic solution Place 1 drop into the left eye 2 (two) times daily.    ? VASCEPA 0.5 gram Cap Take 1 capsule by mouth every 24 hours.    ? timolol (TIMOPTIC) 0.5 % ophthalmic solution      Allergies Allergies Allergen Reactions ? Environmental Allergies Sneezing  Family History: non-contributoryReview of Systems:  ROS: no SOB or CPNo fever, weight lossNo abdominal pain, diarrhea or constipationNo rashNo arthralgias or myalgiasObjective: Vitals:Last 24 hours:  Physical Exam: GEN: NAD, A&OCV: RRRRESP: CTABABD: Soft, NT, NDAssessment: Surveillance for esophageal varicesPlan: Upper endoscopyRisks and benefits explainedSigned:Shena Vinluan Laury Axon, MD 2/16/20238:15 AM

## 2021-10-30 NOTE — Other
Pt transported to PACU via stretcher with CRNA and RN. Pt HD stable, denies SOB/dizziness/CV complaints/pain at this time. Discharge instructions reviewed with pt at bedside, pt verbalizing understanding. All safety maintained. Pt assisted off unit at time of discharge by unit staff via wheelchair for safety.

## 2021-10-30 NOTE — Anesthesia Pre-Procedure Evaluation
This is a 71 y.o. male scheduled for EGD.Review of Systems/ Medical HistoryPatient summary, nursing notes, EKG/Cardiac Studies , Labs, pre-procedure vitals, height, weight and NPO status reviewed.No previous anesthesia concernsAnesthesia Evaluation: Estimated body mass index is 22.21 kg/m? as calculated from the following:  Height as of this encounter: 5' 10 (1.778 m).  Weight as of this encounter: 70.2 kg. CC/HPI: 71 yo M presents for EGD for hepatocellular carcinoma.  PMH includes type 1 diabetes, HTN, HLD, PVD.  LVEF ~45%.Past Surgical History:  Liver biopsy.  Cardiovascular:Patient has a history of: hypercholesterolemia and hypertension.  No angina.  No dyspnea. -Exercise tolerance: >4 METS -Valvular disease: history of valvular problems/murmurs mitral regurgitation -Vascular Disease:  peripheral venous disease.   -Other Cardiovascular:  ECHO 10/2021~ * Normal left ventricular cavity size.  Mild concentric left ventricular hypertrophy.  Mildly decreased left ventricular systolic function.  LVEF estimated by visual assessment was between 45-50%.  Abnormal global longitudinal strain (-15%).  Diastolic function was difficult to determine due to mitral valve abnormalities.~ * Normal right ventricular cavity size.  Mildly decreased right ventricular systolic function.  Estimated right ventricular systolic pressure is 19 mmHg.~ * Left atrium is moderately dilated.  No interatrial shunt by color Doppler.  Right atrium is normal in size.~ * Trace aortic regurgitation.~ * Moderate mitral regurgitation.~ * All visible segments of the aorta are normal in size.~ * IVC diameter < 2.1 cm that collapses > 50% with a sniff suggests normal RAP (0-5 mmHg, mean 3 mmHg).~ * No significant pericardial effusion.~ * A left sided pleural effusion is present.  Ascites is noted.~ * No prior study available for comparison.. Neuromuscular: -Comments: Uses walker to ambulateSkeletal/Skin:  -Joint and Skeletal Disorders: arthritisGastrointestinal/Genitourinary: -Hepatic Disorders:  Patient has liver disease.Endocrine/Metabolic: -Diabetes mellitus:  Patient has diabetes mellitus type 1. He is using insulin.Physical ExamCardiovascular:    Rhythm: regularPulmonary:  Patient's breath sounds clear to auscultationAirway:  Mallampati: IIITM distance: >3 FBNeck ROM: fullMouth Opening: >3cmDental:  Dentition: dentures lower and dentures upperOther Findings: AAObeardAnesthesia PlanASA 3 The primary anesthesia plan is  general. Perioperative Code Status confirmed: It is my understanding that the patient is currently designated as 'Full Code' and will remain so throughout the perioperative period.Anesthesia informed consent obtained. Consent obtained from: patient and spouseThe post operative pain plan is IV analgesics.Plan discussed with CRNA.Anesthesiologist's Pre Op NoteI personally evaluated and examined the patient prior to the intra-operative phase of care on the day of the procedure.Marland Kitchen

## 2021-10-30 NOTE — Other
Pt brought into room 1. Timeout performed. Positioned left lateral. Bite block placed. Sedated and monitored per anesthesia. Pt underwent EGD with Dr. Ferd Glassing. Pt tolerated procedure well. Report given to PACU RN.

## 2021-10-30 NOTE — Other
Operative Diagnosis:Pre-op:     * Hepatocellular carcinoma (HC Code) (HC CODE) (HC Code) [C22.0]Patient Coded Diagnosis   Pre-op diagnosis: Hepatocellular carcinoma (HC Code) (HC CODE) (HC Code)  Post-op diagnosis: Hepatocellular carcinoma (HC Code) (HC CODE) (HC Code)  Patient Diagnosis   None    Post-op diagnosis:   * Hepatocellular carcinoma (HC Code) (HC CODE) (HC Code) [C22.0]Operative Procedure(s) :Procedure(s) (LRB):EGD (N/A)Post-op Procedure & Diagnosis ConfirmationPost-op Diagnosis: Post-op Diagnosis confirmed (no changes)Post-op Procedure: Post-op Procedure confirmed (no changes)

## 2021-10-30 NOTE — Discharge Instructions
Endoscopy & ColonoscopyAfter Care In case of any problems after your procedure, please call [phone] or go to the nearest Emergency Room for any urgent or severe symptoms. Mild esophagitis without esophageal or gastric varicesPlease call for biopsy or cytology results within 5-7 days [phone] see copy of procedure report.HOME CARE INSTRUCTIONSActivityYou may resume your regular activity but take frequent rest periods for the next 24 hours.You may experience abdominal discomfort such as a feeling of fullness or gas pains.Walking will help expel (get rid of) the air and reduce the bloated feeling in your abdomen.You may experience a sore throat for 2 to 3 days (if you had an upper endoscopy). This is normal. Gargling with salt water may help this.You may shower.A companion MUST drive you home, as the sedation impairs your reflexes and judgments.For the remainder of the day, you should not operate any vehicle or heavy machinery or make important decisions due to the sedation.  We recommend resting quietly.NutritionDrink plenty of fluids.Begin with a light meal and progress to your normal diet as tolerated.Avoid alcoholic beverages for 24 hours or as instructed by your caregiver.MedicationsYou may resume your normal medications unless your caregiver tells you otherwise.Follow-up* Please check out with the front desk [phone] before leaving to see if and when your follow up should be scheduled. You will be called within 5-7 business days to schedule a telehealth appointment with our clinicians to review the biopsy/cytology results of your procedure.  If you do not hear from anyone or if you are concerned, please call  [phone].SEEK IMMEDIATE MEDICAL CARE IF:You have excessive nausea (feeling sick to your stomach) and/or vomiting.You have severe abdominal pain and distention (swelling).You have trouble swallowing.You have a temperature over 100? F (37.8? C).You have rectal bleeding, black tarry stools or vomiting of blood.You are unable to urinate or pass bowel movements.Drs. Mauro Kaufmann, Kerby Less, [facility] & Rosanne Gutting want you to have the best procedure experience possible.Please call with any questions, concerns or problems.M-F 8:30 am-4:30 pm at [phone]________________________________________________________________________July 2017

## 2021-11-03 ENCOUNTER — Telehealth: Admit: 2021-11-03 | Payer: PRIVATE HEALTH INSURANCE | Attending: Hematology & Oncology | Primary: Family Medicine

## 2021-11-04 ENCOUNTER — Encounter: Admit: 2021-11-04 | Payer: PRIVATE HEALTH INSURANCE | Attending: Hematology & Oncology | Primary: Family Medicine

## 2021-11-04 ENCOUNTER — Telehealth: Admit: 2021-11-04 | Payer: PRIVATE HEALTH INSURANCE | Attending: Hematology & Oncology | Primary: Family Medicine

## 2021-11-04 DIAGNOSIS — C220 Liver cell carcinoma: Secondary | ICD-10-CM

## 2021-11-04 NOTE — Telephone Encounter
Pt wife called. Have questions regarding treatment and the study program pt is in . Like a call back  # [phone]

## 2021-11-05 ENCOUNTER — Encounter: Admit: 2021-11-05 | Payer: PRIVATE HEALTH INSURANCE | Attending: Family | Primary: Family Medicine

## 2021-11-05 ENCOUNTER — Telehealth: Admit: 2021-11-05 | Payer: PRIVATE HEALTH INSURANCE | Attending: Hematology & Oncology | Primary: Family Medicine

## 2021-11-05 DIAGNOSIS — C220 Liver cell carcinoma: Secondary | ICD-10-CM

## 2021-11-05 DIAGNOSIS — H401131 Primary open-angle glaucoma, bilateral, mild stage: Secondary | ICD-10-CM | POA: Diagnosis not present

## 2021-11-05 NOTE — Telephone Encounter
Patients wife called to cancel his appointment for Monday 11/11/2021 due to he will be seeing a Doctor down in [facility] that Dr,Sabbath referred him to.

## 2021-11-06 ENCOUNTER — Encounter: Admit: 2021-11-06 | Payer: PRIVATE HEALTH INSURANCE | Attending: Family | Primary: Family Medicine

## 2021-11-06 ENCOUNTER — Encounter: Admit: 2021-11-06 | Payer: PRIVATE HEALTH INSURANCE | Primary: Family Medicine

## 2021-11-07 ENCOUNTER — Encounter: Admit: 2021-11-07 | Payer: PRIVATE HEALTH INSURANCE | Primary: Family Medicine

## 2021-11-09 NOTE — Research Notes
Screening: Past Medical History                       HIC# [id number]          Consenting Date: 25JAN2023Baseline Assessment Date: 25JAN2023Subject Name: Ryan Davies                         MRN#: [id number]               Subject ID: [id number]                                                                                                                                                                                                                                                                       Cancer InformationDate of Original Diagnosis: 08/17/2021 (clinical) 09/03/2021 (histologic) Tumor Type: Hepatocellular Carcinoma  Vitals:Vital Signs 10/08/2021@ 2:11P BP 137  BP 74  Pulse 67  Temp 97.9 ?F (36.6 ?C)  Resp 17  Weight lbs. 158.2  Height 5' 8.9 (1.75 m)  BMI (Calculated) 23.4 kg/m2  SpO2 94 %  Labs: 10/08/2021: CBC w/ diff, CMP, AFP Allergies: Seasonal Allergies Line of Therapy(neo-adjuvant, adjuvant, 1st line etc) Previous Treatment for Disease Under Study Details (including pertinent information regarding #cycles, significant complications, etc.) Start Date End Date  None  n/a n/a n/a n/a  Vaccinations received within last year:   COVID-19 (Moderna): 08/13/2020, 12/06/2019, 4/21/2021Influenza Vaccine 10/[facility]/2022  Past Surgical HistorySurgery Date of Surgery Details (including pertinent information regarding indication, complications, etc.) Liver Needle Biopsy (Right Liver Mass) 09/03/2021 Well-differentiated hepatocellular neoplasm most consistent with Hepatocellular carcinoma Colonoscopy [facility]/2009 Polyps, no follow-up Eye Surgery - Right Eye removed  11/[facility]/2021 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 Cataract Surgery  - Left Eye    Substance Use  Use Frequency  Start Date Stop Date Cigarettes  Yes 	1 PPD   Smokeless tobacco No N/A N/A N/A Alcohol  Yes 1-2 glasses wine nightly   Other drug use: Jewel Vape Pen? Yes   ongoing  Past Medical HistoryPrevious Condition CTCAE Grade (if applicable)  Details (including pertinent information regarding complications, treatment, concomitant medications, attribution etc.) Start Date End Date  or Ongoing Glaucoma 2 Surgically removed right eye, Latanoprost, Timolol 11/[facility]/2021 11/[facility]/2021 Cataract 2 Cataract Surgery, Latanoprost, Timolol 11/[facility]/2021 ongoing Abdominal Distention  Percocet 10/[facility]/2022 No abdominal pain currently does not need percocet Fatigue   10/[facility]/2022 ongoing Gastroparesis   7/[facility]/2022 ongoing Anorexia   7/[facility]/2022 ongoing Anxiety  Ativan 12/[facility]/2022 ongoing Insomnia  Chronic 12/[facility]/2022 ongoing Weight Loss  Small frequent meals 12/[facility]/2022 ongoing Constipation  Colace, intermittent, Miralax Senna S 12/[facility]/2022 ongoing Abdominal Pain  Lower 12/[facility]/2022 ongoing Bilateral Hydronephrosis   12/[facility]/2022 ongoing Hypertension  Hydrochlorothiazide-Irbesartan 11/[facility]/2021 ongoing Hyperlipidemia  Vascepa [facility]/UK ongoing Cholesterol High  Rosuvastatin [facility]/UK ongoing Diabetes Mellitus Type I  2 Insulin  ongoing Urinary Retention  Urology consult, FLOMAX 08/20/2021 ongoing GERD   Heart burn, Famotidine 06/18/2021 Ongoing Epistaxis 1 Since childhood [facility]/UK ongoing 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 -unsure of condition never diagnosed [facility]/UK ongoing  Baseline Concomitant MedicationsMedication Single Dose Units Route Frequency Start Date Stop Date Indication Famotidine 40 mg Oral, tablet Daily 06/18/2021 ongoing GERD  Hydrochlorothiazide-Irbesartan 12.5-150 mg Oral, tablet Daily 11/[facility]/2021 ongoing Hypertension Vascepa 1 g Oral, capsule Daily 06/18/2021 07/25/2021 Hyperlipidemia Rosuvastatin 20 mg Oral, tablet Daily [facility]/UK 07/25/2022 Cholesterol High Insulin Lispro (ADMELOG, HUMALOG) 100 units Subcutaneous via pump TID meals, PRN [facility]/UK ongoing Diabetes Mellitus Type I Lorazepam (ATIVAN) 0.5 mg Oral, tablet PRN 12/[facility]/2022 ongoing Anxiety Latanoprost (XALATAN) 1 drop Ophthalmic Solution, 1 One  eye Nightly 11/[facility]/2021 ongoing Glaucoma, Cataract Timolol(BITIMOL) 1 drop Ophthalmic Solution, 1 One  each eye BID 11/[facility]/2021 ongoing Glaucoma, Cataract Docusate Sodium (COLACE) 50 mg Oral, capsule BID  1/[facility]/2023 Constipation Tamsulosin (FLOWMAX) 0.4 mg Oral, capsule Daily 12/[facility]/2022 ongoing Urinary Retention Oxycodone-acetaminophen (PERCOCET) 5-325 mg Oral, tablet Q4H, PRN 08/22/2021 ongoing Abdominal Pain Miralax    Daily 12/[facility]/2022 ongoing Constipation Senna S    PRN 12/[facility].2022 ongoing Constipation Nutrilite Double X Multivitamin 3 Tablets Oral, tablet  Daily [facility]/2000 ongoing General Health Prophylaxis Co-Q 10 1 Tablet Oral,  Daily  [facility]/2010 ongoing General Health Prophylaxis Never really took Aspirin Per Dr. Kathryne Gin, the subject is ineligible due to the following exclusion criteria listed below: 4.1.2.1 Exclusion Criteria for Stage 1- Patient is not only eligible for the control arm4.1.2.6 Exclusion Criteria for [id number] ArmPatients who meet any of the following criteria will be excluded from [id number] arm during Stage 1:- Patient has no left ventricular ejection fraction (LVEF) < 50% assessed by either transthoracic echocardiogram (TTE) or multiple-gated acquisition (MUGA) scan (TTE preferred test) within 6 months from first study drug administration - ECHO performed on 10/16/21

## 2021-11-09 NOTE — Progress Notes
Re: Ryan Davies (04/29/1951)MRN: [id number] Provider: Cresenciano Genre, MDDate of service: 1/25/2023INITIAL CONSULT VISITREFERRAL PHYSICIAN: Harlen Labs, MDREASON FOR CONSULT: HCCHISTORY OF PRESENT ILLNESS:  Ryan Davies  Is 71 y.o. 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 He has lost approximately 25 lbs. He was evaluated initially at [facility].  [place] [facility] showed a dominant liver mass measuring 17 x 16 x 14 cm. In addition to other masses.  He was seen by Dr. Daphene Jaeger on 12/12 and biopsy was ordered.  He was then referred him to me to discuss trial option. He has chronic LE edema and wears compression stockings. He has proliferative diabetic retinopathy and glaucoma.  PAST MEDICAL HISTORY: Ryan Davies has a past medical history of Diabetes mellitus (HC Code), Hypercholesteremia, Hypertension, and Peripheral vascular disease (HC Code) (HC CODE) (HC Code).PAST SURGICAL HISTORY: Mikolaj Woolstenhulme has a past surgical history that includes Cataract Removal/IOL Implant.SOCIAL HISTORY:Mr. Venable  reports that he has been smoking cigarettes. He has a 55.00 pack-year smoking history. He does not have any smokeless tobacco history on file. He reports current alcohol use of about 14.0 standard drinks per week. No history on file for drug use.  Now vapes.  No more alcohol.  Lives with his wife.  FAMILY HISTORY: His family history includes Cancer in his brother; Diabetes in his brother and mother; Heart disease in his brother, father, mother, and sister.ALLERGIES: Environmental allergiesCURRENT MEDICATIONS: has a current medication list which includes the following prescription(s): docusate sodium, famotidine, insulin lispro, irbesartan-hydrochlorothiazide, latanoprost, lorazepam, oxycodone-acetaminophen, tamsulosin, and timolol.VHQ:IONGEX of SystemsA comprehensive 12-point review of systems was queried and is otherwise negative. ECOG PS = 1 PHYSICAL EXAM:Vitals:  10/08/21 1411 BP: 137/74 Pulse: 67 Resp: 17 Temp: 97.9 ?F (36.6 ?C) Physical ExamHENT:    Head: Normocephalic. Eyes:    Conjunctiva/sclera: Conjunctivae normal. Cardiovascular:    Rate and Rhythm: Normal rate. Pulmonary:    Effort: Pulmonary effort is normal. Abdominal:    Palpations: Abdomen is soft. Musculoskeletal:       General: Swelling present. Skin:   General: Skin is warm. Neurological:    Mental Status: He is alert and oriented to person, place, and time. Psychiatric:       Behavior: Behavior normal. LABORATORY DATA:Results for orders placed or performed during the hospital encounter of 10/08/21 Alphafetoprotein, tumor marker Result Value Ref Range  AFP-Tumor Marker  [facility]) 1.0 <9.0 ng/mL CBC auto differential Result Value Ref Range  WBC 8.4 4.0 - 11.0 x1000/?L  RBC 4.72 4.00 - 6.00 M/?L  Hemoglobin 14.2 13.2 - 17.1 g/dL  Hematocrit 52.84 13.24 - 50.00 %  MCV 92.8 80.0 - 100.0 fL  MCH 30.1 27.0 - 33.0 pg  MCHC 32.4 31.0 - 36.0 g/dL  RDW-CV 40.1 02.7 - 25.3 %  Platelets 506 (H) 150 - 420 x1000/?L  MPV 9.3 8.0 - 12.0 fL  Neutrophils 74.9 (H) 39.0 - 72.0 %  Lymphocytes 15.4 (L) 17.0 - 50.0 %  Monocytes 7.7 4.0 - 12.0 %  Eosinophils 1.0 0.0 - 5.0 %  Basophil 0.6 0.0 - 1.4 %  Immature Granulocytes 0.4 0.0 - 1.0 %  nRBC 0.0 0.0 - 1.0 %  ANC(Abs Neutrophil Count) 6.27 2.00 - 7.60 x 1000/?L  Absolute Lymphocyte Count 1.29 0.60 - 3.70 x 1000/?L  Monocyte Absolute Count 0.64 0.00 - 1.00 x 1000/?L  Eosinophil Absolute Count 0.08 0.00 - 1.00 x 1000/?L  Basophil Absolute Count 0.05 0.00 -  1.00 x 1000/?L  Absolute Immature Granulocyte Count 0.03 0.00 - 0.30 x 1000/?L  Absolute nRBC 0.00 0.00 - 1.00 x 1000/?L Comprehensive metabolic panel Result Value Ref Range  Sodium 136 136 - 144 mmol/L  Potassium 4.0 3.3 - 5.3 mmol/L  Chloride 94 (L) 98 - 107 mmol/L  CO2 29 20 - 30 mmol/L  Anion Gap 13 7 - 17  Glucose 127 (H) 70 - 100 mg/dL  BUN 13 8 - 23 mg/dL  Creatinine 1.61 0.96 - 1.30 mg/dL  Calcium 9.3 8.8 - 04.5 mg/dL  BUN/Creatinine Ratio 40.9 8.0 - 23.0  Total Protein 6.8 6.6 - 8.7 g/dL  Albumin 3.8 3.6 - 4.9 g/dL  Total Bilirubin 1.3 (H) <=1.2 mg/dL  Alkaline Phosphatase 811 (H) 9 - 122 U/L  Alanine Aminotransferase (ALT) 69 (H) 9 - 59 U/L  Aspartate Aminotransferase (AST) 118 (H) 10 - 35 U/L  Globulin 3.0 2.3 - 3.5 g/dL  A/G Ratio 1.3 1.0 - 2.2  AST/ALT Ratio 1.7 See Comment  eGFR (Creatinine) >60 >=60 mL/min/1.27m2 RADIOLOGY: OUTSIDE INTERPRETATION [place] ABDOMEN AND/OR PELVISHISTORY/INDICATION:  HCCThis second interpretation is requested by the treating physician and the interpretation will directly contribute to the diagnosis and treatment of the patient. Second Interpretation Date: 10/06/2021 11:35 AMOriginal Acquisition Date: 12/4/2022COMPARISON: NoneTECHNIQUE: [place] images of the abdomen and pelvis were obtained after the administration of Omnipaque 300 intravenous contrast, per image annotation.?FINDINGS: ?The visualized lung bases are grossly clear other than mild bibasilar atelectasis, within the limitations of breathing motion.?There is a dominant 17.2 x 13.7 x 16.0 cm lobulated partially exophytic and heterogeneously enhancing mass appearing to originate from the left hepatic lobe. Small regions of calcification within the mass are noted, presumably dystrophic. This lesion causes mass effect on the stomach, epigastric structures and adjacent bowel and colon. The remainder of the hepatic parenchyma is nearly completely replaced by additional enhancing masses, for example measuring 7.3 cm at the hepatic dome, and 8.8 cm in the right liver. The dominant mass causes moderate narrowing of the main portal vein, which remains grossly patent. Somewhat linear scattered hypoattenuating regions throughout the liver parenchyma are nonspecific and may represent a combination of focal biliary ductal dilatation, although regions of portal vein thrombosis (bland or tumor) cannot be excluded, for example in the left portal vein (image 28, series 2) and on the right (image 25, series 2). The central hepatic veins are grossly patent although difficult to evaluate peripherally. ?The gallbladder is nearly collapsed, with minimal nonspecific gallbladder wall thickening. The spleen and adrenal glands are unremarkable. There is mild prominence of the pancreatic duct to 3 mm, nonspecific, possibly related to mass effect. ?There is moderate bilateral hydroureteronephrosis to the level of the ureterovesicular junctions, in the setting of marked bladder dilatation and trabeculation. There are a few punctate nonobstructing left renal calculi in addition to presumed vascular calcifications on the right, and a too small to characterize right renal hypodensity. The prostate is enlarged.?Retained oral contrast is seen in the distal esophagus. There is no bowel obstruction or ascites other than trace perihepatic fluid. A 1.7 cm peripherally enhancing lesion adjacent to the porta may represent a lymph node. No other enlarged abdominal or pelvic adenopathy is appreciated.?The hepatic artery arises from the superior mesenteric artery. There is severe calcified atherosclerotic disease of the abdominal aorta and branch vessels. The right common iliac artery appears occluded, with distal reconstitution of the femoral artery. The left common iliac artery is diminutive, also with reconstitution of the external iliac and femoral artery  distally.?There is a nonspecific 1.1 cm lucency in the right iliac wing.IMPRESSION:?Dominant 17 cm abdominal mass is felt to originate from the left hepatic lobe, consistent with hepatocellular carcinoma. Innumerable metastases are identified throughout the liver. Relatively tubular hypoattenuating regions within the liver may represent a combination of focal biliary ductal dilatation, as well as potential bland/tumor thrombus within the portal veins as described.?Moderate bilateral hydroureteronephrosis, presumably secondary to bladder obstruction in the setting of marked urinary bladder distention.?Severe atherosclerotic disease of the abdomen and pelvic vessels as describedPATHOLOGY:  [facility], [place], [place], [id number], 7 SLS, 09/03/21: ? LIVER, BIOPSY: ? ? ?- MODERATELY DIFFERENTIATED HEPATOCELLULAR CARCINOMA  ASSESSMENT/PLAN:  Mr. Cuny is a 71 year old man with a hx of type 1 diabetes, htn, glaucoma, now with HCC.  We discussed options for treatment including atezolizumab and bevacizumab.  We also discussed the option of the MORPHEUS HCC study HIC#?[phone]?? which uses atezo and bev as the backbone of different combinations. I reviewed the treatment arms in detail and reviewed the clinical trial process. He will need an EKG, ECHO, repeat imaging, EGD, and biopsy. He also signed the optional consent for additional sample collection. After discussion, Estefan decided to be screened for the study and signed consent. We will work on getting all of the screening studies done and will schedule follow up.  All of their questions were answered.

## 2021-11-10 ENCOUNTER — Inpatient Hospital Stay: Admit: 2021-11-10 | Discharge: 2021-11-10 | Payer: MEDICARE | Primary: Family Medicine

## 2021-11-10 ENCOUNTER — Ambulatory Visit: Admit: 2021-11-10 | Payer: MEDICARE | Primary: Family Medicine

## 2021-11-10 ENCOUNTER — Ambulatory Visit: Admit: 2021-11-10 | Payer: MEDICARE | Attending: Hematology & Oncology | Primary: Family Medicine

## 2021-11-10 ENCOUNTER — Encounter: Admit: 2021-11-10 | Payer: PRIVATE HEALTH INSURANCE | Attending: Hematology & Oncology | Primary: Family Medicine

## 2021-11-10 DIAGNOSIS — E1139 Type 2 diabetes mellitus with other diabetic ophthalmic complication: Secondary | ICD-10-CM

## 2021-11-10 DIAGNOSIS — Z9641 Presence of insulin pump (external) (internal): Secondary | ICD-10-CM

## 2021-11-10 DIAGNOSIS — Z79899 Other long term (current) drug therapy: Secondary | ICD-10-CM

## 2021-11-10 DIAGNOSIS — I739 Peripheral vascular disease, unspecified: Secondary | ICD-10-CM

## 2021-11-10 DIAGNOSIS — H42 Glaucoma in diseases classified elsewhere: Secondary | ICD-10-CM

## 2021-11-10 DIAGNOSIS — Z5112 Encounter for antineoplastic immunotherapy: Secondary | ICD-10-CM

## 2021-11-10 DIAGNOSIS — R600 Localized edema: Secondary | ICD-10-CM

## 2021-11-10 DIAGNOSIS — R188 Other ascites: Secondary | ICD-10-CM

## 2021-11-10 DIAGNOSIS — Z79620 Long term (current) use of immunosuppressive biologic: Secondary | ICD-10-CM

## 2021-11-10 DIAGNOSIS — I10 Essential (primary) hypertension: Secondary | ICD-10-CM

## 2021-11-10 DIAGNOSIS — Z794 Long term (current) use of insulin: Secondary | ICD-10-CM

## 2021-11-10 DIAGNOSIS — C220 Liver cell carcinoma: Secondary | ICD-10-CM

## 2021-11-10 DIAGNOSIS — E11319 Type 2 diabetes mellitus with unspecified diabetic retinopathy without macular edema: Secondary | ICD-10-CM

## 2021-11-10 DIAGNOSIS — E7800 Pure hypercholesterolemia, unspecified: Secondary | ICD-10-CM

## 2021-11-10 DIAGNOSIS — E119 Type 2 diabetes mellitus without complications: Secondary | ICD-10-CM

## 2021-11-10 LAB — CBC WITH AUTO DIFFERENTIAL
BKR BILIRUBIN TOTAL: 0.6 % (ref 0.0–1.4)
BKR BLOOD UREA NITROGEN: 476 x1000/??L — ABNORMAL HIGH (ref 150–420)
BKR WAM ABSOLUTE IMMATURE GRANULOCYTES.: 0.03 x 1000/??L (ref 0.00–0.30)
BKR WAM ABSOLUTE LYMPHOCYTE COUNT.: 1.15 x 1000/??L (ref 0.60–3.70)
BKR WAM ABSOLUTE NRBC (2 DEC): 0 x 1000/??L (ref 0.00–1.00)
BKR WAM ANALYZER ANC: 6 x 1000/??L — ABNORMAL HIGH (ref 2.00–7.60)
BKR WAM BASOPHIL ABSOLUTE COUNT.: 0.05 x 1000/??L (ref 0.00–1.00)
BKR WAM BASOPHILS: 0.6 % (ref 0.0–1.4)
BKR WAM EOSINOPHIL ABSOLUTE COUNT.: 0.14 x 1000/??L (ref 0.00–1.00)
BKR WAM EOSINOPHILS: 1.7 % (ref 0.0–5.0)
BKR WAM HEMATOCRIT (2 DEC): 38.7 % (ref 38.50–50.00)
BKR WAM HEMOGLOBIN: 13.3 g/dL (ref 13.2–17.1)
BKR WAM IMMATURE GRANULOCYTES: 0.4 % (ref 0.0–1.0)
BKR WAM LYMPHOCYTES: 14.2 % — ABNORMAL LOW (ref 17.0–50.0)
BKR WAM MCH (PG): 31.2 pg (ref 27.0–33.0)
BKR WAM MCHC: 34.4 g/dL (ref 31.0–36.0)
BKR WAM MCV: 90.8 fL — ABNORMAL LOW (ref 80.0–100.0)
BKR WAM MONOCYTE ABSOLUTE COUNT.: 0.74 x 1000/??L (ref 0.00–1.00)
BKR WAM MONOCYTES: 9.1 % (ref 4.0–12.0)
BKR WAM MPV: 9.6 fL (ref 8.0–12.0)
BKR WAM NEUTROPHILS: 74 % — ABNORMAL HIGH (ref 39.0–72.0)
BKR WAM NUCLEATED RED BLOOD CELLS: 0 % — ABNORMAL HIGH (ref 0.0–1.0)
BKR WAM PLATELETS: 476 x1000/ÂµL — ABNORMAL HIGH (ref 150–420)
BKR WAM RED BLOOD CELL COUNT.: 4.26 M/??L (ref 4.00–6.00)
BKR WAM WHITE BLOOD CELL COUNT: 8.1 x1000/??L (ref 4.0–11.0)

## 2021-11-10 LAB — COMPREHENSIVE METABOLIC PANEL
BKR A/G RATIO: 1.2 (ref 1.0–2.2)
BKR ALANINE AMINOTRANSFERASE (ALT): 71 U/L — ABNORMAL HIGH (ref 9–59)
BKR ALBUMIN: 3.7 g/dL (ref 3.6–4.9)
BKR ALKALINE PHOSPHATASE: 521 U/L — ABNORMAL HIGH (ref 9–122)
BKR ANION GAP: 13 g/dL (ref 7–17)
BKR ASPARTATE AMINOTRANSFERASE (AST): 95 U/L — ABNORMAL HIGH (ref 10–35)
BKR AST/ALT RATIO: 1.3 x 1000/??L (ref 0.00–1.00)
BKR BUN / CREAT RATIO: 29.3 — ABNORMAL HIGH (ref 8.0–23.0)
BKR CALCIUM: 9.2 mg/dL — ABNORMAL HIGH (ref 8.8–10.2)
BKR CHLORIDE: 91 mmol/L — ABNORMAL LOW (ref 98–107)
BKR CO2: 26 mmol/L (ref 20–30)
BKR CREATININE: 0.58 mg/dL (ref 0.40–1.30)
BKR EGFR, CREATININE (CKD-EPI 2021): >60 mL/min/1.73m2 (ref >=60–1.00)
BKR GLOBULIN: 3 g/dL (ref 2.3–3.5)
BKR GLUCOSE: 138 mg/dL — ABNORMAL HIGH (ref 70–100)
BKR POTASSIUM: 4.7 mmol/L (ref 3.3–5.3)
BKR PROTEIN TOTAL: 6.7 g/dL (ref 6.6–8.7)
BKR SODIUM: 130 mmol/L — ABNORMAL LOW (ref 136–144)
BKR WAM RDW-CV: 138 mg/dL — ABNORMAL HIGH (ref 70–100)

## 2021-11-10 MED ORDER — BEVACIZUMAB-AWWB INFUSION
Freq: Once | INTRAVENOUS | Status: DC
Start: 2021-11-10 — End: 2021-11-10

## 2021-11-10 MED ORDER — FAMOTIDINE 4 MG/ML IN 0.9% SODIUM CHLORIDE (ADULT)
Freq: Once | INTRAVENOUS | Status: DC | PRN
Start: 2021-11-10 — End: 2021-11-15

## 2021-11-10 MED ORDER — FUROSEMIDE 20 MG TABLET
20 mg | ORAL_TABLET | Freq: Every day | ORAL | 1 refills | Status: AC
Start: 2021-11-10 — End: 2021-12-15

## 2021-11-10 MED ORDER — HYDROCORTISONE SODIUM SUCCINATE 100 MG SOLUTION FOR INJECTION
100 mg | Freq: Once | INTRAVENOUS | Status: DC | PRN
Start: 2021-11-10 — End: 2021-11-15

## 2021-11-10 MED ORDER — ALBUTEROL SULFATE 2.5 MG/3 ML (0.083 %) SOLUTION FOR NEBULIZATION
2.5 mg /3 mL (0.083 %) | RESPIRATORY_TRACT | Status: DC | PRN
Start: 2021-11-10 — End: 2021-11-15

## 2021-11-10 MED ORDER — BEVACIZUMAB-AWWB INFUSION
Freq: Once | INTRAVENOUS | Status: CP
Start: 2021-11-10 — End: ?
  Administered 2021-11-10: 16:00:00 100.000 mL/h via INTRAVENOUS

## 2021-11-10 MED ORDER — DIPHENHYDRAMINE 50 MG/ML INJECTION (WRAPPED E-RX)
50 mg/mL | Freq: Once | INTRAVENOUS | Status: DC | PRN
Start: 2021-11-10 — End: 2021-11-15

## 2021-11-10 MED ORDER — EPINEPHRINE 0.3 MG/0.3 ML INJECTION, AUTO-INJECTOR
0.3 mg/ mL | INTRAMUSCULAR | Status: DC | PRN
Start: 2021-11-10 — End: 2021-11-15

## 2021-11-10 MED ORDER — ATEZOLIZUMAB (TECENTRIQ) 1200MG INFUSION
Freq: Once | INTRAVENOUS | Status: CP
Start: 2021-11-10 — End: ?
  Administered 2021-11-10: 14:00:00 250.000 mL/h via INTRAVENOUS

## 2021-11-10 MED ORDER — SODIUM CHLORIDE 0.9 % BOLUS (NEW BAG)
0.9 % | Freq: Once | INTRAVENOUS | Status: DC | PRN
Start: 2021-11-10 — End: 2021-11-15

## 2021-11-10 MED ORDER — MEPERIDINE 25 MG/2.5 ML IN 0.9% SODIUM CHLORIDE
Freq: Once | INTRAVENOUS | Status: DC | PRN
Start: 2021-11-10 — End: 2021-11-15

## 2021-11-10 NOTE — Progress Notes
Second Nurse verification done for today 11/10/21,for c1d1

## 2021-11-10 NOTE — Other
PHARMACIST: CANCER TREATMENT NOTEPatient Diagnosis: HCCAttending: Meredith Mody, MD Protocol/Regimen Name: Lacretia Nicks + BevacizumabCycle: 1 Treatment plan medications: Atezolizumab 1200 mg IV over 1 hourBevacizumab 15 mg/kg IV over 30 min Cycle repeats 21 days Supportive Care:?	Emesis prophylaxis: minimal Drug Interaction assessment: no significant interactions noted Current Outpatient Medications on File Prior to Encounter Medication Sig Dispense Refill ? albuterol sulfate 90 mcg/actuation HFA aerosol inhaler    ? coenzyme Q10 10 mg capsule Take 1 capsule (10 mg total) by mouth daily.   ? escitalopram oxalate (LEXAPRO) 10 mg tablet Take 1 tablet (10 mg total) by mouth daily.   ? famotidine (PEPCID) 40 mg tablet Take 1 tablet (40 mg total) by mouth daily.   ? insulin lispro (ADMELOG, HUMALOG) 100 unit/mL vial Inject under the skin 3 (three) times daily before meals.   ? irbesartan-hydrochlorothiazide (AVALIDE) 150-12.5 mg per tablet Take 1 tablet by mouth daily.   ? latanoprost (XALATAN) 0.005 % ophthalmic solution Place 1 drop into the left eye nightly.   ? LORazepam (ATIVAN) 0.5 mg tablet Take 1 tablet (0.5 mg total) by mouth every 6 (six) hours as needed for anxiety.   ? multivitamin capsule Take 1 capsule by mouth daily.   ? oxyCODONE-acetaminophen (PERCOCET) 5-325 mg per tablet Take 1 tablet by mouth every 4 (four) hours as needed for pain.   ? tamsulosin (FLOMAX) 0.4 mg 24 hr capsule Take 1 capsule (0.4 mg total) by mouth daily.   ? timoloL (BETIMOL) 0.5 % ophthalmic solution Place 1 drop into the left eye 2 (two) times daily.   No current facility-administered medications on file prior to encounter. Signed consent: obtainedPrior Auth: approvedPatient Description: 12 M Comments regarding dose calculations: actual body weight Monitoring ParametersCBC and CMP reviewedCBC:Recent Results (from the past 8736 hour(s)) CBC auto differential  Collection Time: 11/10/21  7:06 AM Result Value Ref Range  WBC 8.1 4.0 - 11.0 x1000/?L  RBC 4.26 4.00 - 6.00 M/?L  Hemoglobin 13.3 13.2 - 17.1 g/dL  Hematocrit 75.64 33.29 - 50.00 %  MCV 90.8 80.0 - 100.0 fL  MCH 31.2 27.0 - 33.0 pg  MCHC 34.4 31.0 - 36.0 g/dL  RDW-CV 51.8 (H) 84.1 - 15.0 %  Platelets 476 (H) 150 - 420 x1000/?L  MPV 9.6 8.0 - 12.0 fL  Neutrophils 74.0 (H) 39.0 - 72.0 %  Lymphocytes 14.2 (L) 17.0 - 50.0 %  Monocytes 9.1 4.0 - 12.0 %  Eosinophils 1.7 0.0 - 5.0 %  Basophil 0.6 0.0 - 1.4 %  Immature Granulocytes 0.4 0.0 - 1.0 %  nRBC 0.0 0.0 - 1.0 %  ANC(Abs Neutrophil Count) 6.00 2.00 - 7.60 x 1000/?L  Absolute Lymphocyte Count 1.15 0.60 - 3.70 x 1000/?L  Monocyte Absolute Count 0.74 0.00 - 1.00 x 1000/?L  Eosinophil Absolute Count 0.14 0.00 - 1.00 x 1000/?L  Basophil Absolute Count 0.05 0.00 - 1.00 x 1000/?L  Absolute Immature Granulocyte Count 0.03 0.00 - 0.30 x 1000/?L  Absolute nRBC 0.00 0.00 - 1.00 x 1000/?L [id number] Results (from the past 8736 hour(s)) Comprehensive metabolic panel  Collection Time: 11/10/21  7:06 AM Result Value Ref Range  Sodium 130 (L) 136 - 144 mmol/L  Potassium 4.7 3.3 - 5.3 mmol/L  Chloride 91 (L) 98 - 107 mmol/L  CO2 26 20 - 30 mmol/L  Anion Gap 13 7 - 17  Glucose 138 (H) 70 - 100 mg/dL  BUN 17 8 - 23 mg/dL  Creatinine 1.09 3.23 - 1.30 mg/dL  Calcium 9.2 8.8 - 55.7 mg/dL  BUN/Creatinine  Ratio 29.3 (H) 8.0 - 23.0  Total Protein 6.7 6.6 - 8.7 g/dL  Albumin 3.7 3.6 - 4.9 g/dL  Total Bilirubin 1.2 <=1.9 mg/dL  Alkaline Phosphatase 147 (H) 9 - 122 U/L  Alanine Aminotransferase (ALT) 71 (H) 9 - 59 U/L  Aspartate Aminotransferase (AST) 95 (H) 10 - 35 U/L  Globulin 3.0 2.3 - 3.5 g/dL  A/G Ratio 1.2 1.0 - 2.2  AST/ALT Ratio 1.3 See Comment  eGFR (Creatinine) >60 >=60 mL/min/1.74m2 LDH: No results found for this or any previous visit (from the past 8736 hour(s)).Urine protein: to be monitored every 3rd dose of bevBP: to be monitored through bevacizumab eClCr = 87 ml/min  Oncology Treatment History: 10/07/21 Liver bx showed HCC 11/10/21 C1D1 Atezo + BevChemotherapy note completed [id number] Signed by Clabe Seal, PharmD, November 10, 2021

## 2021-11-10 NOTE — Progress Notes
Presented to treatment chair in stable condition with wife and daughter for C1D1 of Atezolizumab/Bevacizumab protocol. Marland Kitchen  PIV placed and labs drawn by lab RN.  Seen in f/u by Dr. Meredith Mody and cleared to proceed with treatment.  Treatment expectations reviewed.  Urine dip = trace protein.  BP = 136/71.  New Patient Folder provided.  Pt and family stated teaching materials provided and possible side effects reviewed by MD.  Signed consent obtained by MD today.  Brisk blood return obtained from PIV.  Atezolizumab infused over 60 minutes without incident.  Bevacizumab infused over 30 minutes and tolerated well.  PIV removed and dry drsg applied.  March calender provided.  Discharged in care of family.  RTC 3/20 for C2D1.

## 2021-11-11 ENCOUNTER — Telehealth: Admit: 2021-11-11 | Payer: PRIVATE HEALTH INSURANCE | Primary: Family Medicine

## 2021-11-11 ENCOUNTER — Ambulatory Visit: Admit: 2021-11-11 | Payer: PRIVATE HEALTH INSURANCE | Primary: Family Medicine

## 2021-11-11 LAB — ALPHAFETOPROTEIN, TUMOR MARKER: BKR AFP-TUMOR MARKER (YH): 1 ng/mL (ref <9.0)

## 2021-11-11 LAB — TSH W/REFLEX TO FT4     (BH GH LMW Q YH): BKR THYROID STIMULATING HORMONE: 2.46 ??IU/mL

## 2021-11-11 NOTE — Progress Notes
Nutrition: Received consult per pt/family request, pt starting Atezo/Bev for HCC. Will plan to visit when pt scheduled for next infusion 3/20.Electronically Signed by Chancy Milroy, RD, February 28, 2023DESK [phone] [phone]

## 2021-11-11 NOTE — Telephone Encounter
11/11/2021   1:47 PM Post Treatment Call Post Treatment call made? Yes Spoke with:  family member      Comment: wife [name]   Temperature >100 degrees F? No   Shaking chills No   For patients with peripheral IV access device: Any redness, pain, swelling at line site? No Pain 0 Lack of appetite No      Comment: baseline no change   Problems with eating No   Problems with drinking No Nausea No Constipation No Diarrhea No Problems with Urinating  No      Comment: baseline no change Shortness of Breath No      Comment: baseline no change Rash No Difficulty performing activities of daily living No      Comment: baseline no change Was pegfilgrastim included in the treatment plan?  No Medications reviewed with patient using teachback? Yes Patient/Family/Other able to explain how to take medications and verbalize understanding of information provided? Yes Patient education provided? Yes      Comment: all education provided on tx day Education provided for the following topics: Who and when to call with problems;Rash;Wellbeing;Fever Patient/Family/Other verbalized understanding of information/education provided? Yes All questions answered? Yes Confirmed patient has correct phone number to call? Yes Patient/Family/Other able to state date and time of next appointment? Yes Next Appointment Date: 12/11/2021      Comment: C2D1 Next Appointment Time: 10:30

## 2021-11-11 NOTE — Progress Notes
Re: Ryan Davies (April 29, 1951)MRN: [id number] Provider: Cresenciano Genre, MDDate of service: 2/27/2023FOLLOWUP NOTEDIAGNOSIS: HCCCURRENT THERAPY: starting atezolizumab and bevacizumab today PRIOR THERAPY: EGDECHOBiopsy HISTORY OF PRESENT ILLNESS:  Ryan Davies  Is 71 y.o. 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 He has lost approximately 25 lbs. He was evaluated initially at [facility].  [place] showed a dominant liver mass measuring 17 x 16 x 14 cm. In addition to other masses.  He was seen by Dr. Daphene Jaeger on 12/12 and biopsy was ordered.  He was then referred him to me to discuss trial option. ?He has chronic LE edema and wears compression stockings. He has proliferative diabetic retinopathy and glaucoma.  INTERIM HISTORY: Ryan Davies returns with his wife and their daughter to start treatment.  We had spoken over the phone last week.  I let them know that the ECHO had showed an EF of 45-50%, not the 50% threshold required for the study. He could have a cardiac MRI to further evaluate.  After discussion, he decided to receive atezo and bev off of study. ALLERGIES: Environmental allergiesCURRENT MEDICATIONS: has a current medication list which includes the following prescription(s): albuterol sulfate, coenzyme q10, escitalopram oxalate, famotidine, insulin lispro, irbesartan-hydrochlorothiazide, latanoprost, lorazepam, multivitamin, oxycodone-acetaminophen, tamsulosin, and timolol.WUJ:WJXBJY of SystemsA comprehensive 12-point review of systems was queried and is otherwise negative. ECOG PS = 1 PHYSICAL EXAM:Vitals:  11/10/21 0801 BP: 136/71 Pulse: 77 Resp: 18 Temp: 98.7 ?F (37.1 ?C) Physical ExamConstitutional:     Comments: Sitting in wheelchair, comfortable  HENT:    Head: Normocephalic. Eyes:    Conjunctiva/sclera: Conjunctivae normal. Cardiovascular:    Rate and Rhythm: Normal rate. Pulmonary:    Effort: Pulmonary effort is normal. Abdominal:    General: There is distension.    Palpations: Abdomen is soft.    Comments: + ascites  Musculoskeletal:       General: Swelling present. Skin:   General: Skin is warm. Neurological:    Mental Status: He is oriented to person, place, and time. Psychiatric:       Behavior: Behavior normal. LABORATORY DATA:Results for orders placed or performed during the hospital encounter of 11/10/21 CBC auto differential Result Value Ref Range  WBC 8.1 4.0 - 11.0 x1000/?L  RBC 4.26 4.00 - 6.00 M/?L  Hemoglobin 13.3 13.2 - 17.1 g/dL  Hematocrit 78.29 56.21 - 50.00 %  MCV 90.8 80.0 - 100.0 fL  MCH 31.2 27.0 - 33.0 pg  MCHC 34.4 31.0 - 36.0 g/dL  RDW-CV 30.8 (H) 65.7 - 15.0 %  Platelets 476 (H) 150 - 420 x1000/?L  MPV 9.6 8.0 - 12.0 fL  Neutrophils 74.0 (H) 39.0 - 72.0 %  Lymphocytes 14.2 (L) 17.0 - 50.0 %  Monocytes 9.1 4.0 - 12.0 %  Eosinophils 1.7 0.0 - 5.0 %  Basophil 0.6 0.0 - 1.4 %  Immature Granulocytes 0.4 0.0 - 1.0 %  nRBC 0.0 0.0 - 1.0 %  ANC(Abs Neutrophil Count) 6.00 2.00 - 7.60 x 1000/?L  Absolute Lymphocyte Count 1.15 0.60 - 3.70 x 1000/?L  Monocyte Absolute Count 0.74 0.00 - 1.00 x 1000/?L  Eosinophil Absolute Count 0.14 0.00 - 1.00 x 1000/?L  Basophil Absolute Count 0.05 0.00 - 1.00 x 1000/?L  Absolute Immature Granulocyte Count 0.03 0.00 - 0.30 x 1000/?L  Absolute nRBC 0.00 0.00 - 1.00 x 1000/?L Comprehensive metabolic panel Result Value Ref Range  Sodium 130 (L) 136 - 144 mmol/L  Potassium 4.7 3.3 - 5.3 mmol/L  Chloride 91 (L) 98 - 107 mmol/L  CO2 26 20 - 30 mmol/L  Anion Gap 13 7 - 17  Glucose 138 (H) 70 - 100 mg/dL  BUN 17 8 - 23 mg/dL  Creatinine 1.61 0.96 - 1.30 mg/dL  Calcium 9.2 8.8 - 04.5 mg/dL  BUN/Creatinine Ratio 40.9 (H) 8.0 - 23.0  Total Protein 6.7 6.6 - 8.7 g/dL  Albumin 3.7 3.6 - 4.9 g/dL  Total Bilirubin 1.2 <=8.1 mg/dL  Alkaline Phosphatase 191 (H) 9 - 122 U/L  Alanine Aminotransferase (ALT) 71 (H) 9 - 59 U/L  Aspartate Aminotransferase (AST) 95 (H) 10 - 35 U/L  Globulin 3.0 2.3 - 3.5 g/dL  A/G Ratio 1.2 1.0 - 2.2  AST/ALT Ratio 1.3 See Comment  eGFR (Creatinine) >60 >=60 mL/min/1.34m2 RADIOLOGIC STUDIES:  ASSESSMENT/PLAN:Ryan Davies is a 71 year old man with HCC with hx of type 1 diabetes, htn, glaucoma.We discussed options for treatment including atezolizumab and bevacizumab.  We also discussed the option of the MORPHEUS HCC study HIC#?[phone] which uses atezo and bev as the backbone of different combinations. After consent he had screening tests performed, ECHO was not within limits for the study.  Starting atezolizumab and bevacizumab today. Labs reviewed and wnl.Start lasix 20 mg daily as tolerated for LE edema and ascites.Will refer to hepatology. Return in 3 weeks. I spent 30  minutes in direct contact with patient, spouse and daughter, of which at least half was spent in counseling and coordination of care.

## 2021-11-12 NOTE — Progress Notes
RN Coordinator Hand-Off Note:Patient had initial consultation with Dr. Meredith Mody on 10/08/21 to discuss treatment options for [facility]. Consent for [id number] was signed during appointment. GI clinical trial team to arrange for testing and follow-up with Dr. Meredith Mody per study protocol. Supportive care services, nutrition and SW referrals placed..Electronically Signed by Vincent Gros, RN, January 25, 2023Addendum: Patient decided not to proceed with clinical trial and will start [facility] atezo/bev on 11/10/21. Educational materials regarding chemotherapy side effects and what to expect during the first treatment were sent to the patient via mychart. Supportive care services, nutrition and SW referrals placed.Electronically Signed by Vincent Gros, RN, November 06, 2021

## 2021-11-14 ENCOUNTER — Telehealth: Admit: 2021-11-14 | Payer: PRIVATE HEALTH INSURANCE | Attending: Hematology & Oncology | Primary: Family Medicine

## 2021-11-14 NOTE — Telephone Encounter
Pt wife nancy called, pt is very lethargic, weak, wobbly on his feel. Seems out of it. 4 days post first tx. Would like to talk to someone, not sure if this is normal call back # is [phone]

## 2021-11-14 NOTE — Telephone Encounter
RTC to patient's wife. She states patient is dazed/out of it today, increased wobbley with walking. Took x 1 percocet this AM for worsening back pain without good effect. Urinating every hour. Had 2-3 very small BMs today. She reports normal temp and BP 130/70s. Advised ED for further workup, she states she will try to bring him but is unsure he will be agreeable, reviewed risks of not going, she verbalized understanding. Offered to talk with patient, she declined. She will call back if needed.

## 2021-11-17 ENCOUNTER — Ambulatory Visit (AMBULATORY_SURGERY_CENTER): Payer: Medicare Other

## 2021-11-17 ENCOUNTER — Other Ambulatory Visit: Payer: Self-pay

## 2021-11-17 VITALS — Ht 68.0 in | Wt 132.0 lb

## 2021-11-17 DIAGNOSIS — Z86010 Personal history of colonic polyps: Secondary | ICD-10-CM

## 2021-11-17 MED ORDER — PEG 3350-KCL-NA BICARB-NACL 420 G PO SOLR: 4000.0000 mL | Freq: Once | ORAL | 0 refills | Status: AC

## 2021-11-17 NOTE — Progress Notes (Signed)
No egg or soy allergy known to patient  ?No issues known to pt with past sedation with any surgeries or procedures ?Patient denies ever being told they had issues or difficulty with intubation  ?No FH of Malignant Hyperthermia ?Pt is not on diet pills ?Pt is not on home 02  ?Pt is not on blood thinners  ?Pt denies issues with constipation  ?No A fib or A flutter ?Pt is fully vaccinated for Covid x 2; ?NO PA's for preps discussed with pt in PV today  ?Discussed with pt there will be an out-of-pocket cost for prep and that varies from $0 to 70 + dollars - pt verbalized understanding  ?Due to the COVID-19 pandemic we are asking patients to follow certain guidelines in PV and the [place]   ?Pt aware of COVID protocols and [facility] guidelines  ?PV completed over the phone. Pt verified name, DOB, address and insurance during PV today.  ?Pt mailed instruction packet with copy of consent form to read and not return, and instructions.  ?Pt encouraged to call with questions or issues.  ?If pt has My chart, procedure instructions sent via My Chart  ? ?

## 2021-11-27 ENCOUNTER — Telehealth: Admit: 2021-11-27 | Payer: PRIVATE HEALTH INSURANCE | Attending: Hematology & Oncology | Primary: Family Medicine

## 2021-11-27 NOTE — Telephone Encounter
Pt wife called. Stated  pt was released from hospital on 3/9. Since then she has not been able to get pt up and walking. Pt is station on the sofa. Want to know if his treatment schedule on Monday is mandatory? Will there be an issue if treatment is more than three weeks apart?  Would like a call back. [phone]

## 2021-11-27 NOTE — Telephone Encounter
RTC to patients wife [name], she is concerned about bringing him in on Monday as she can not get him out of the house. He was in the hospital from 3/6-3/9 for weakness. He is now home with home care nurses every other day. He has been eating and drinking more in the past couple days. I explained that I am concerned as he is not able to stand more than a few mins with a max assist of his nurse and daughter (per spouse). His vitals are stable as of this am when she check them his BP is 130/69, HR 79 and Temp of 97.7. She expressed that he does not want to go back to the hospital and is now a DNR/DNI. Their home care agency is having their hospice RN come out on Tuesday to discuss hospice options with patient. I discuss the though of hospice with [name] and stated that he does not want to go to the hospital but is not ready to not have treatment. I explained that I will discuss with Dr.Stein but feel that they need to have a conversation regarding plan and goals of care. Pt will not be able to physically make apts on Monday. We will discuss with Dr. Meredith Mody option for televisit to discuss. [name] was thankful for the call and support.

## 2021-11-28 ENCOUNTER — Ambulatory Visit: Admit: 2021-11-28 | Payer: MEDICARE | Attending: Hematology & Oncology | Primary: Family Medicine

## 2021-11-28 ENCOUNTER — Encounter: Admit: 2021-11-28 | Payer: PRIVATE HEALTH INSURANCE | Primary: Family Medicine

## 2021-11-28 NOTE — Progress Notes
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 He has lost approximately 25 lbs. He was evaluated initially at [facility]. ?Rhodes showed a dominant liver mass measuring 17 x 16 x 14 cm. In addition to other masses. ?He was seen by Dr. Daphene Davies on 12/12 and biopsy was ordered. ?He was then referred him to me to discuss trial option. 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 He has proliferative diabetic retinopathy and glaucoma.?INTERIM HISTORY: Ryan Davies had a telephone visit secondary to COVID-19.  His wife was also on the phone. He was hospitalized a few days at [facility] after being hypotensive.  His wife reports that he was diagnosed with infections and is now finishing up a course of antibiotics - should be finished on Monday.  They think he is too weak for treatment on Monday and would like to reschedule. ALLERGIES: Environmental allergiesCURRENT MEDICATIONS: has a current medication list which includes the following prescription(s): albuterol sulfate, coenzyme q10, escitalopram oxalate, famotidine, furosemide, insulin lispro, irbesartan-hydrochlorothiazide, latanoprost, lorazepam, multivitamin, oxycodone-acetaminophen, tamsulosin, and timolol.WUJ:WJXBJY of SystemsA comprehensive 12-point review of systems was queried and is otherwise negative. PHYSICAL EXAM:There were no vitals filed for this visit. LABORATORY DATA:No new labs RADIOLOGIC STUDIES: no new imaging  ASSESSMENT/PLAN:Ryan Davies  is a 71 year old man with HCC with hx of type 1 diabetes, htn, glaucoma.?We discussed options for treatment including atezolizumab and bevacizumab. ?We also discussed the option of the MORPHEUS HCC study HIC#?[id number]????which uses atezo and bev as the backbone of different combinations. ?After consent he had screening tests performed, ECHO was not within limits for the study.  ?He started atezolizumab and bevacizumab 11/10/2021.He was recently hospitalized at [facility] with infections. We discussed future plan. His wife had questions about palliative care. They are hesitant to think about hospice now because he would like to try to continue systemic therapy. They think that he will feel up to coming in on Monday March 27th. We discussed that if he continues to feel too weak to come in, we should discuss focusing on more supportive care at home. For now, continue current home care and reschedule appt for 3/27. TELEPHONE VISIT: For this visit the clinician and patient were present via telephone (audio only).Patient counseled on available options for visit type; Patient elected telephone visit; Patient consent given for telephone visit: YesPatient Identity was confirmed during this call.  Other individuals actively participating in the telephone encounter and their name/relation to the patient: none and spouse, Ryan Davies Total time spent in medical telephone consultation: 17Because this visit was completed over telephone, a hands-on physical exam was not performed.  Patient understands and knows to call back if condition changes.

## 2021-11-29 DIAGNOSIS — C220 Liver cell carcinoma: Secondary | ICD-10-CM

## 2021-12-01 ENCOUNTER — Ambulatory Visit: Admit: 2021-12-01 | Payer: MEDICARE | Primary: Family Medicine

## 2021-12-01 ENCOUNTER — Ambulatory Visit: Admit: 2021-12-01 | Payer: PRIVATE HEALTH INSURANCE | Primary: Family Medicine

## 2021-12-01 ENCOUNTER — Ambulatory Visit: Admit: 2021-12-01 | Payer: MEDICARE | Attending: Hematology & Oncology | Primary: Family Medicine

## 2021-12-01 NOTE — Progress Notes
Oncology Nutrition AssessmentLocation of visit: GI ONC NHType of visit: follow-upPast Medical History: reviewed available dataNutritionally relevant medications/supplements: reviewedNutritionally relevant labs: reviewedBMI: Estimated body mass index is 20.42 kg/m? as calculated from the following:  Height as of 10/27/21: 5' 10 (1.778 m).  Weight as of 11/10/21: 64.5 kg.   IBW: 72.6 kg ([place])Usual body weight:170# (77.2kg) about 6 months ago per ptWeight changes and assessment: Last available wt 1 month ago. Net 7.7% loss (5.4kg) x past 3 months, significant for timeframe. Chronic LE edema likely masking some wt loss.Wt: 11/10/21 64.5 kg 10/30/21 70.2 kg 10/08/21 71.8 kg 09/26/21 68.7 kg 08/25/21 69.9 kg Nutrition Focused Physical Findings: N/A by phone. Chronic LE edema noted in recent MD note.Nutritional Needs:  1935 calories (30kcal/kg)  77-97 gm pro(1.2-1.5g protein/kg ) Based on  CA with treatment, 64.5kg recent wt Diet history or considerationsFood Allergies/intolerances or aversions: NKFACultural/lifestyle/family needs: none identifiedSupport system: wife supportive, adult children involvedActivity level and/or physical limitations: relying on wheelchair, standing occasionally, sedentary x ~2 wks since discharge 3/9/23Nutrition assessment: Met with wife by phone for follow up. Wife reports pt continues with early satiety, denies nausea (taking antiemetic x past 2 wks with good effect) and any other nutrition impact sx. Improved po intake x past 2 wks eating every 2-3 hrs about 5-6x/day. Improved intake vs 6 wks ago last RD phone visit. Recent intake likely >75% needs x past 2 wks, including egg/toast/bacon or egg/pancake, peanut butter on toast, buttered noodles with chicken, snaks of ice cream or fruit cup or chocolate covered cashews. Occasionally wife makes homemade vanilla protein powder shake (21 g pro/serving) with fruit and greens. Pt still dislikes and refuses ONS alone or in shakes. Nutrition Diagnosis:Unintentional weight loss   related to early satiety as evidenced by wt is down 12# (7.1%)  from reported UBW over a 6 month period-continues, most recently net 7.7% loss x past 3 months, although po intake improvingNutrition Intervention: Specific foods/beverages or groups , Commercial beverage  , Priority modifications  , Nutrition relationship to health/disease and Recommended modifications  - Suggested daily supplementation with protein powder in high kcal shake, puddings, discussed other applications for variety- Provided tips to increase kcals within small portion meals/snacks- Brainstormed high kcal/pro snacks convenient and shelf-stable to minimize cooking/prep from family- Reinforced good protein sources to emphasize with all meals/snacksGoals by next nutrition visit:1.  Pt will consume 5-6 smaller more frequent meals/snacks per day-met, continue2. Pt will consume protein supplement x1/day on x6/wk.Education Materials provided: noneNutrition monitoring and evaluation: Oral fluids , Liquid meal replacement or supplement , Amount of food, Weight  and Digestive system  Learning assessment barriers: none identifiedComprehension level: good, wife engaged in discussion and questionsExpected compliance/motivation: good compliant most of the timeFollow up:As needed: Provided direct contact information and encouraged to contact as needed. Length of time spent with pt:  	15 minutesSeen ZO:[id number] Signed by Chancy Milroy, RD, December 01, 2021

## 2021-12-04 ENCOUNTER — Telehealth: Admit: 2021-12-04 | Payer: PRIVATE HEALTH INSURANCE | Attending: Hematology & Oncology | Primary: Family Medicine

## 2021-12-04 NOTE — Telephone Encounter
Pt wife nancy called to see if tx can be moved to [facility] since. Trip to [facility] is a little too much for the pt sometimes.

## 2021-12-04 NOTE — Telephone Encounter
RTC to [place]. She advised that she is going to call their previous oncologist at [facility] and see if they can transfer care back, she will call our office to update what they decide.

## 2021-12-04 NOTE — Telephone Encounter
Pt wife [name] called stating she was referred to Dr.Stein by Dr.Sabbath to the [place] location but is asking if she can switch pt back to the [place] office because of the distance.pt can be reached at [phone]

## 2021-12-05 ENCOUNTER — Encounter: Payer: Self-pay | Admitting: Gastroenterology

## 2021-12-05 ENCOUNTER — Encounter: Admit: 2021-12-05 | Payer: PRIVATE HEALTH INSURANCE | Primary: Family Medicine

## 2021-12-05 ENCOUNTER — Telehealth: Admit: 2021-12-05 | Payer: PRIVATE HEALTH INSURANCE | Attending: Hematology & Oncology | Primary: Family Medicine

## 2021-12-05 ENCOUNTER — Telehealth: Admit: 2021-12-05 | Payer: PRIVATE HEALTH INSURANCE | Primary: Family Medicine

## 2021-12-05 NOTE — Progress Notes
SOCIAL WORK NOTEPatient Name: Ryan Davies Record Number: [id number] Date of Birth: 08/31/1952Medical Social Work Follow Up  [employer] Most Recent Value Admission Information  Document Type Progress Note Reason for Current Social Work Involvement Support/Coping Source of Information Audiological scientist Comment Ryan Davies?s wife Ryan Davies Record Reviewed Yes Level of Care Ambulatory Psychosocial issues requiring intervention Support Psychosocial interventions 15 minutes spent via telephone with Ryan Davies?s wife Ryan Davies to provide support. I introduced myself and social work role. Ryan Davies expressed an interest in obtaining information for body donation for herself and Ryan Davies. I provided Ryan Davies with information and process to explore for body donation. Ryan Davies did not express any psychosocial needs. I provided Ryan Davies with my contact information for follow up as needed. Collaborations Treatment team Handoff Required? No Next Steps/Plan (including hand-off): Social work intervention complete. Please re-consult as needed. Signature: Alger Memos, LMSW Contact Information: [phone]

## 2021-12-05 NOTE — Telephone Encounter
Pt wife called again to see if someone can give her a call back about pt transfer back to this location because of the distance. Pt can be reached at

## 2021-12-05 NOTE — Telephone Encounter
Called Ryan Davies to follow up if they would like to keep appt in [facility] for Monday, she did not hear back from [place] yet about transferring care, she will call them again and let us know.

## 2021-12-05 NOTE — Telephone Encounter
Returned call to [place] who requested pt see Dr Daphene Jaeger Monday and get treated here in [place]. Explained to her Dr Daphene Jaeger is away and won't return until next week, also we don't have any appointment available for Monday. Encouraged her to keep the appoitments they have set up in [facility] and we will discuss with Dr Daphene Jaeger when he returns when to schedule appointment here and call her to schedule.

## 2021-12-08 ENCOUNTER — Ambulatory Visit: Admit: 2021-12-08 | Payer: MEDICARE | Primary: Family Medicine

## 2021-12-08 ENCOUNTER — Telehealth: Admit: 2021-12-08 | Payer: PRIVATE HEALTH INSURANCE | Attending: Hematology & Oncology | Primary: Family Medicine

## 2021-12-08 ENCOUNTER — Ambulatory Visit: Admit: 2021-12-08 | Payer: MEDICARE | Attending: Hematology & Oncology | Primary: Family Medicine

## 2021-12-08 NOTE — Telephone Encounter
RTC to [place]. They would like to cancel today, they are waiting to hear from [place] about transferring care. They will update [place] once they have confirmation.

## 2021-12-10 ENCOUNTER — Ambulatory Visit (AMBULATORY_SURGERY_CENTER): Payer: Medicare Other | Admitting: Gastroenterology

## 2021-12-10 ENCOUNTER — Encounter: Payer: Self-pay | Admitting: Gastroenterology

## 2021-12-10 ENCOUNTER — Other Ambulatory Visit: Payer: Self-pay

## 2021-12-10 VITALS — BP 154/73 | HR 67 | Temp 96.6°F | Resp 12 | Ht 68.0 in | Wt 132.0 lb

## 2021-12-10 DIAGNOSIS — Z86010 Personal history of colonic polyps: Secondary | ICD-10-CM | POA: Diagnosis not present

## 2021-12-10 DIAGNOSIS — D120 Benign neoplasm of cecum: Secondary | ICD-10-CM

## 2021-12-10 DIAGNOSIS — D128 Benign neoplasm of rectum: Secondary | ICD-10-CM

## 2021-12-10 MED ORDER — SODIUM CHLORIDE 0.9 % IV SOLN: 500.0000 mL | Freq: Once | INTRAVENOUS | Status: DC

## 2021-12-10 NOTE — Patient Instructions (Signed)
Handout on polyps and hemorrhoids given. ? ?Await pathology results. ? ?Please call Dr. Lynne Leader office and schedule an appointment for dysphagia. ? ?YOU HAD AN ENDOSCOPIC PROCEDURE TODAY AT [facility]:   Refer to the procedure report that was given to you for any specific questions about what was found during the examination.  If the procedure report does not answer your questions, please call your gastroenterologist to clarify.  If you requested that your care partner not be given the details of your procedure findings, then the procedure report has been included in a sealed envelope for you to review at your convenience later. ? ?YOU SHOULD EXPECT: Some feelings of bloating in the abdomen. Passage of more gas than usual.  Walking can help get rid of the air that was put into your GI tract during the procedure and reduce the bloating. If you had a lower endoscopy (such as a colonoscopy or flexible sigmoidoscopy) you may notice spotting of blood in your stool or on the toilet paper. If you underwent a bowel prep for your procedure, you may not have a normal bowel movement for a few days. ? ?Please Note:  You might notice some irritation and congestion in your nose or some drainage.  This is from the oxygen used during your procedure.  There is no need for concern and it should clear up in a day or so. ? ?SYMPTOMS TO REPORT IMMEDIATELY: ? ?Following lower endoscopy (colonoscopy or flexible sigmoidoscopy): ? Excessive amounts of blood in the stool ? Significant tenderness or worsening of abdominal pains ? Swelling of the abdomen that is new, acute ? Fever of 100?F or higher ? ?For urgent or emergent issues, a gastroenterologist can be reached at any hour by calling [phone]. ?Do not use MyChart messaging for urgent concerns.  ? ? ?DIET:  We do recommend a small meal at first, but then you may proceed to your regular diet.  Drink plenty of fluids but you should avoid alcoholic beverages for 24  hours. ? ?ACTIVITY:  You should plan to take it easy for the rest of today and you should NOT DRIVE or use heavy machinery until tomorrow (because of the sedation medicines used during the test).   ? ?FOLLOW UP: ?Our staff will call the number listed on your records 48-72 hours following your procedure to check on you and address any questions or concerns that you may have regarding the information given to you following your procedure. If we do not reach you, we will leave a message.  We will attempt to reach you two times.  During this call, we will ask if you have developed any symptoms of COVID 19. If you develop any symptoms (ie: fever, flu-like symptoms, shortness of breath, cough etc.) before then, please call [phone].  If you test positive for Covid 19 in the 2 weeks post procedure, please call and report this information to [place].   ? ?If any biopsies were taken you will be contacted by phone or by letter within the next 1-3 weeks.  Please call us at [phone] if you have not heard about the biopsies in 3 weeks.  ? ? ?SIGNATURES/CONFIDENTIALITY: ?You and/or your care partner have signed paperwork which will be entered into your electronic medical record.  These signatures attest to the fact that that the information above on your After Visit Summary has been reviewed and is understood.  Full responsibility of the confidentiality of this discharge information lies with you  and/or your care-partner.  ?

## 2021-12-10 NOTE — Progress Notes (Signed)
PT taken to PACU. Monitors in place. VSS. Report given to RN. 

## 2021-12-10 NOTE — Progress Notes (Signed)
? ?History & Physical ? ?Primary Care Physician:  Denita Lung, MD ?Primary Gastroenterologist: Lucio Edward, MD ? ?CHIEF COMPLAINT: Personal history of colon polyps  ? ?HPI: Phillip Ryan is a 71 y.o. male with personal history of multiple adenomatous colon polyps on colonoscopy in 2020 for surveillance colonoscopy.  ? ? ?Past Medical History:  ?Diagnosis Date  ? Glaucoma   ? uses eye drops  ? Pleural effusion   ? Tubular adenoma of colon 05/2008  ? ? ?Past Surgical History:  ?Procedure Laterality Date  ? CERVICAL FUSION  2009  ? COLONOSCOPY  2020  ? MS-MAC-suprep(good)-hems/5 polyps/TA x 5-recall 3 yr  ? ? ?Prior to Admission medications   ?Medication Sig Start Date End Date Taking? Authorizing Provider  ?dorzolamide-timolol (COSOPT) 22.3-6.8 MG/ML ophthalmic solution 1 drop 2 (two) times daily. 07/09/21  Yes [provider]  ?latanoprost (XALATAN) 0.005 % ophthalmic solution SMARTSIG:1 Drop(s) In Eye(s) Every Evening 05/07/21  Yes [provider]  ?tadalafil (CIALIS) 20 MG tablet Take 1 tablet (20 mg total) by mouth daily as needed for erectile dysfunction. 10/09/21   Denita Lung, MD  ? ? ?Current Outpatient Medications  ?Medication Sig Dispense Refill  ? dorzolamide-timolol (COSOPT) 22.3-6.8 MG/ML ophthalmic solution 1 drop 2 (two) times daily.    ? latanoprost (XALATAN) 0.005 % ophthalmic solution SMARTSIG:1 Drop(s) In Eye(s) Every Evening    ? tadalafil (CIALIS) 20 MG tablet Take 1 tablet (20 mg total) by mouth daily as needed for erectile dysfunction. 30 tablet 5  ? ?Current Facility-Administered Medications  ?Medication Dose Route Frequency Provider Last Rate Last Admin  ? 0.9 %  sodium chloride infusion  500 mL Intravenous Once Ladene Artist, MD      ? ? ?Allergies as of 12/10/2021  ? (No Known Allergies)  ? ? ?Family History  ?Problem Relation Age of Onset  ? Colon cancer Neg Hx   ? Colon polyps Neg Hx   ? Esophageal cancer Neg Hx   ? Rectal cancer Neg Hx   ? Stomach cancer  Neg Hx   ? ? ?Social History  ? ?Socioeconomic History  ? Marital status: Married  ?  Spouse name: Not on file  ? Number of children: Not on file  ? Years of education: Not on file  ? Highest education level: Not on file  ?Occupational History  ? Not on file  ?Tobacco Use  ? Smoking status: Former  ? Smokeless tobacco: Never  ?Vaping Use  ? Vaping Use: Never used  ?Substance and Sexual Activity  ? Alcohol use: Yes  ?  Alcohol/week: 1.0 - 3.0 standard drink  ?  Types: 1 - 3 Standard drinks or equivalent per week  ? Drug use: No  ? Sexual activity: Yes  ?Other Topics Concern  ? Not on file  ?Social History Narrative  ? Not on file  ? ?Social Determinants of Health  ? ?Financial Resource Strain: Not on file  ?Food Insecurity: Not on file  ?Transportation Needs: Not on file  ?Physical Activity: Not on file  ?Stress: Not on file  ?Social Connections: Not on file  ?Intimate Partner Violence: Not on file  ? ? ?Review of Systems: ? ?All systems reviewed an negative except where noted in HPI. ? ?Gen: Denies any fever, chills, sweats, anorexia, fatigue, weakness, malaise, weight loss, and sleep disorder ?CV: Denies chest pain, angina, palpitations, syncope, orthopnea, PND, peripheral edema, and claudication. ?Resp: Denies dyspnea at rest, dyspnea with exercise, cough, sputum, wheezing, coughing  up blood, and pleurisy. ?GI: Denies vomiting blood, jaundice, and fecal incontinence.   Denies dysphagia or odynophagia. ?GU : Denies urinary burning, blood in urine, urinary frequency, urinary hesitancy, nocturnal urination, and urinary incontinence. ?MS: Denies joint pain, limitation of movement, and swelling, stiffness, low back pain, extremity pain. Denies muscle weakness, cramps, atrophy.  ?Derm: Denies rash, itching, dry skin, hives, moles, warts, or unhealing ulcers.  ?Psych: Denies depression, anxiety, memory loss, suicidal ideation, hallucinations, paranoia, and confusion. ?Heme: Denies bruising, bleeding, and enlarged lymph  nodes. ?Neuro:  Denies any headaches, dizziness, paresthesias. ?Endo:  Denies any problems with DM, thyroid, adrenal function. ? ? ?Physical Exam: ?General:  Alert, well-developed, in NAD ?Head:  Normocephalic and atraumatic. ?Eyes:  Sclera clear, no icterus.   Conjunctiva pink. ?Ears:  Normal auditory acuity. ?Mouth:  No deformity or lesions.  ?Neck:  Supple; no masses . ?Lungs:  Clear throughout to auscultation.   No wheezes, crackles, or rhonchi. No acute distress. ?Heart:  Regular rate and rhythm; no murmurs. ?Abdomen:  Soft, nondistended, nontender. No masses, hepatomegaly. No obvious masses.  Normal bowel .    ?Rectal:  Deferred   ?Msk:  Symmetrical without gross deformities.Marland Kitchen ?Pulses:  Normal pulses noted. ?Extremities:  Without edema. ?Neurologic:  Alert and  oriented x4;  grossly normal neurologically. ?Skin:  Intact without significant lesions or rashes. ?Cervical Nodes:  No significant cervical adenopathy. ?Psych:  Alert and cooperative. Normal mood and affect. ? ? ?Impression / Plan:  ? ?Personal history of multiple adenomatous colon polyps on colonoscopy in 2020 for surveillance colonoscopy. ? ?Ramiel Forti T. Fuller Plan  12/10/2021, 1:50 PM ?See Shea Evans, [facility], to contact our on call provider ? ? ?  ?

## 2021-12-10 NOTE — Progress Notes (Signed)
Pt's states no medical or surgical changes since previsit or office visit. 

## 2021-12-10 NOTE — Op Note (Addendum)
[place] ?Patient Name: Phillip Ryan ?Procedure Date: 12/10/2021 1:22 PM ?MRN: [id number] ?Endoscopist: Ladene Artist , MD ?Age: 71 ?Referring MD:  ?Date of Birth: 1951/03/30 ?Gender: Male ?Account #: [id number] ?Procedure:                Colonoscopy ?Indications:              Surveillance: Personal history of adenomatous  ?                          polyps on last colonoscopy 3 years ago ?Medicines:                Monitored Anesthesia Care ?Procedure:                Pre-Anesthesia Assessment: ?                          - Prior to the procedure, a History and Physical  ?                          was performed, and patient medications and  ?                          allergies were reviewed. The patient's tolerance of  ?                          previous anesthesia was also reviewed. The risks  ?                          and benefits of the procedure and the sedation  ?                          options and risks were discussed with the patient.  ?                          All questions were answered, and informed consent  ?                          was obtained. Prior Anticoagulants: The patient has  ?                          taken no previous anticoagulant or antiplatelet  ?                          agents. ASA Grade Assessment: II - A patient with  ?                          mild systemic disease. After reviewing the risks  ?                          and benefits, the patient was deemed in  ?                          satisfactory condition to undergo the procedure. ?  After obtaining informed consent, the colonoscope  ?                          was passed under direct vision. Throughout the  ?                          procedure, the patient's blood pressure, pulse, and  ?                          oxygen saturations were monitored continuously.The  ?                          ileocecal valve, appendiceal orifice, and rectum  ?                          were photographed. The IC  valve photo did not  ?                          capture. The quality of the bowel preparation was  ?                          adequate after extensive lavage, suction. The  ?                          colonoscopy was performed without difficulty. The  ?                          patient tolerated the procedure well. The Olympus  ?                          PCF-H190DL ([id number]) Colonoscope was introduced  ?                          through the and advanced to the. ?Scope In: 1:53:35 PM ?Scope Out: 2:14:11 PM ?Scope Withdrawal Time: 0 hours 17 minutes 44 seconds  ?Total Procedure Duration: 0 hours 20 minutes 36 seconds  ?Findings:                 The perianal and digital rectal examinations were  ?                          normal. ?                          A 3 mm polyp was found in the cecum. The polyp was  ?                          sessile. The polyp was removed with a cold biopsy  ?                          forceps. Resection and retrieval were complete. ?                          Four sessile polyps were found in the rectum (3)  ?  and cecum (1). The polyps were 6 to 7 mm in size.  ?                          These polyps were removed with a cold snare.  ?                          Resection and retrieval were complete. ?                          Internal hemorrhoids were found during  ?                          retroflexion. The hemorrhoids were small and Grade  ?                          I (internal hemorrhoids that do not prolapse). ?                          The exam was otherwise without abnormality on  ?                          direct and retroflexion views. ?Complications:            No immediate complications. Estimated blood loss:  ?                          None. ?Estimated Blood Loss:     Estimated blood loss: none. ?Impression:               - One 3 mm polyp in the cecum, removed with a cold  ?                          biopsy forceps. Resected and retrieved. ?                           - Four 6 to 7 mm polyps in the rectum and in the  ?                          cecum, removed with a cold snare. Resected and  ?                          retrieved. ?                          - Internal hemorrhoids. ?                          - The examination was otherwise normal on direct  ?                          and retroflexion views. ?Recommendation:           - Repeat colonoscopy after studies are complete for  ?  surveillance based on pathology results with a more  ?                          extensive bowel prep. ?                          - Patient has a contact number available for  ?                          emergencies. The signs and symptoms of potential  ?                          delayed complications were discussed with the  ?                          patient. Return to normal activities tomorrow.  ?                          Written discharge instructions were provided to the  ?                          patient. ?                          - Resume previous diet. ?                          - Continue present medications. ?                          - Await pathology results. ?                          - GI office appt to evaluate dysphagia. ?Ladene Artist, MD ?12/10/2021 2:17:50 PM ?This report has been signed electronically. ?

## 2021-12-10 NOTE — Progress Notes (Signed)
Called to room to assist during endoscopic procedure.  Patient ID and intended procedure confirmed with present staff. Received instructions for my participation in the procedure from the performing physician.  

## 2021-12-11 ENCOUNTER — Telehealth: Payer: Self-pay

## 2021-12-11 NOTE — Telephone Encounter (Signed)
Follow up with Dr. Fuller Plan has been scheduled for 01/15/22 at 1:50pm. Left message for patient to call back. ?

## 2021-12-12 ENCOUNTER — Telehealth: Payer: Self-pay | Admitting: *Deleted

## 2021-12-12 NOTE — Telephone Encounter (Signed)
?  Follow up Call- ? ? ?  12/10/2021  ? 12:52 PM  ?Call back number  ?Post procedure Call Back phone  # [phone]  ?Permission to leave phone message Yes  ?  ? ?Patient questions: ? ?Do you have a fever, pain , or abdominal swelling? No. ?Pain Score  0 * ? ?Have you tolerated food without any problems? Yes.   ? ?Have you been able to return to your normal activities? Yes.   ? ?Do you have any questions about your discharge instructions: ?Diet   No. ?Medications  No. ?Follow up visit  No. ? ?Do you have questions or concerns about your Care? No. ? ?Actions: ?* If pain score is 4 or above: ?No action needed, pain <4. ? ? ?

## 2021-12-12 NOTE — Telephone Encounter (Signed)
No answer for post procedure follow up call. LEft VM. 

## 2021-12-12 NOTE — Telephone Encounter (Signed)
Patient is aware of follow up appointment.  ?

## 2021-12-15 ENCOUNTER — Inpatient Hospital Stay: Admit: 2021-12-15 | Discharge: 2021-12-15 | Payer: MEDICARE | Primary: Family Medicine

## 2021-12-15 ENCOUNTER — Ambulatory Visit: Admit: 2021-12-15 | Payer: MEDICARE | Primary: Family Medicine

## 2021-12-15 ENCOUNTER — Ambulatory Visit: Admit: 2021-12-15 | Payer: MEDICARE | Attending: Hematology & Oncology | Primary: Family Medicine

## 2021-12-15 DIAGNOSIS — Z8616 Personal history of COVID-19: Secondary | ICD-10-CM

## 2021-12-15 DIAGNOSIS — Z5112 Encounter for antineoplastic immunotherapy: Secondary | ICD-10-CM

## 2021-12-15 DIAGNOSIS — E7800 Pure hypercholesterolemia, unspecified: Secondary | ICD-10-CM

## 2021-12-15 DIAGNOSIS — I10 Essential (primary) hypertension: Secondary | ICD-10-CM

## 2021-12-15 DIAGNOSIS — Z66 Do not resuscitate: Secondary | ICD-10-CM

## 2021-12-15 DIAGNOSIS — Z79620 Long term (current) use of immunosuppressive biologic: Secondary | ICD-10-CM

## 2021-12-15 DIAGNOSIS — Z794 Long term (current) use of insulin: Secondary | ICD-10-CM

## 2021-12-15 DIAGNOSIS — E871 Hypo-osmolality and hyponatremia: Secondary | ICD-10-CM

## 2021-12-15 DIAGNOSIS — E1151 Type 2 diabetes mellitus with diabetic peripheral angiopathy without gangrene: Secondary | ICD-10-CM

## 2021-12-15 DIAGNOSIS — Z87891 Personal history of nicotine dependence: Secondary | ICD-10-CM

## 2021-12-15 DIAGNOSIS — Z809 Family history of malignant neoplasm, unspecified: Secondary | ICD-10-CM

## 2021-12-15 DIAGNOSIS — Z79899 Other long term (current) drug therapy: Secondary | ICD-10-CM

## 2021-12-15 DIAGNOSIS — C220 Liver cell carcinoma: Secondary | ICD-10-CM

## 2021-12-15 LAB — MANUAL DIFFERENTIAL
BKR WAM BANDS (DIFF) (1 DEC): 3 % (ref 0.0–10.0)
BKR WAM BASOPHIL - ABS (DIFF) 2 DEC: 0 x 1000/??L (ref 0.00–1.00)
BKR WAM BASOPHILS (DIFF): 0 % (ref 0.0–1.4)
BKR WAM EOSINOPHILS (DIFF) 2 DEC: 0.64 x 1000/??L (ref 0.00–1.00)
BKR WAM EOSINOPHILS (DIFF): 6 % — ABNORMAL HIGH (ref 0.0–5.0)
BKR WAM LYMPHOCYTE - ABS (DIFF) 2 DEC: 1.5 x 1000/??L (ref 0.60–3.70)
BKR WAM LYMPHOCYTES (DIFF): 14 % — ABNORMAL LOW (ref 17.0–50.0)
BKR WAM MONOCYTE - ABS (DIFF) 2 DEC: 1.18 x 1000/??L — ABNORMAL HIGH (ref 0.00–1.00)
BKR WAM MONOCYTES (DIFF): 11 % (ref 4.0–12.0)
BKR WAM NEUTROPHILS (DIFF): 66 % (ref 39.0–72.0)
BKR WAM NEUTROPHILS - ABS (DIFF) 2 DEC: 7.38 x 1000/??L (ref 2.00–7.60)
BKR WAM NORMAL RBC MORPHOLOGY: NORMAL

## 2021-12-15 LAB — CBC WITH AUTO DIFFERENTIAL
BKR WAM ABSOLUTE NRBC (2 DEC): 0 x 1000/ÂµL (ref 0.00–1.00)
BKR WAM HEMATOCRIT (2 DEC): 36.1 % — ABNORMAL LOW (ref 38.50–50.00)
BKR WAM HEMOGLOBIN: 11.8 g/dL — ABNORMAL LOW (ref 13.2–17.1)
BKR WAM MCH (PG): 29.1 pg (ref 27.0–33.0)
BKR WAM MCHC: 32.7 g/dL (ref 31.0–36.0)
BKR WAM MCV: 88.9 fL (ref 80.0–100.0)
BKR WAM MPV: 8.3 fL (ref 8.0–12.0)
BKR WAM NUCLEATED RED BLOOD CELLS: 0 % (ref 0.0–1.0)
BKR WAM PLATELETS: 572 x1000/ÂµL — ABNORMAL HIGH (ref 150–420)
BKR WAM RDW-CV: 15.6 % — ABNORMAL HIGH (ref 11.0–15.0)
BKR WAM RED BLOOD CELL COUNT.: 4.06 M/??L (ref 4.00–6.00)
BKR WAM WHITE BLOOD CELL COUNT: 10.7 x1000/??L (ref 4.0–11.0)

## 2021-12-15 LAB — COMPREHENSIVE METABOLIC PANEL
BKR A/G RATIO: 1.1 (ref 1.0–2.2)
BKR ALANINE AMINOTRANSFERASE (ALT): 46 U/L — ABNORMAL HIGH (ref 9–59)
BKR ALBUMIN: 3 g/dL — ABNORMAL LOW (ref 3.6–4.9)
BKR ALKALINE PHOSPHATASE: 646 U/L — ABNORMAL HIGH (ref 9–122)
BKR ANION GAP: 7 (ref 7–17)
BKR ASPARTATE AMINOTRANSFERASE (AST): 81 U/L — ABNORMAL HIGH (ref 10–35)
BKR AST/ALT RATIO: 1.8
BKR BILIRUBIN TOTAL: 0.8 mg/dL (ref <=1.2)
BKR BLOOD UREA NITROGEN: 20 mg/dL (ref 8–23)
BKR BUN / CREAT RATIO: 23.3 x 1000/??L — ABNORMAL HIGH (ref 8.0–23.0)
BKR BUN / CREAT RATIO: 81 U/L — ABNORMAL HIGH (ref 10–35)
BKR CALCIUM: 8.6 mg/dL — ABNORMAL LOW (ref 8.8–10.2)
BKR CHLORIDE: 96 mmol/L — ABNORMAL LOW (ref 98–107)
BKR CO2: 27 mmol/L (ref 20–30)
BKR CREATININE: 0.86 mg/dL (ref 0.40–1.30)
BKR EGFR, CREATININE (CKD-EPI 2021): >60 mL/min/{1.73_m2} (ref >=60)
BKR GLOBULIN: 2.7 g/dL (ref 2.3–3.5)
BKR GLUCOSE: 201 mg/dL — ABNORMAL HIGH (ref 70–100)
BKR POTASSIUM: 5.2 mmol/L — ABNORMAL HIGH (ref 3.3–5.1)
BKR PROTEIN TOTAL: 5.7 g/dL — ABNORMAL LOW (ref 6.6–8.7)
BKR SODIUM: 130 mmol/L — ABNORMAL LOW (ref 136–144)

## 2021-12-15 LAB — TSH W/REFLEX TO FT4     (BH GH LMW Q YH): BKR THYROID STIMULATING HORMONE: 2.12 ??IU/mL

## 2021-12-15 MED ORDER — PANTOPRAZOLE 40 MG TABLET,DELAYED RELEASE
40 mg | Status: AC
Start: 2021-12-15 — End: ?

## 2021-12-15 MED ORDER — ONDANSETRON HCL 4 MG TABLET
4 mg | Status: AC
Start: 2021-12-15 — End: ?

## 2021-12-15 MED ORDER — ATEZOLIZUMAB (TECENTRIQ) 1200MG INFUSION
Freq: Once | INTRAVENOUS | Status: CP
Start: 2021-12-15 — End: ?
  Administered 2021-12-15: 17:00:00 250.000 mL/h via INTRAVENOUS

## 2021-12-15 MED ORDER — BEVACIZUMAB-AWWB INFUSION
Freq: Once | INTRAVENOUS | Status: CP
Start: 2021-12-15 — End: ?
  Administered 2021-12-15: 17:00:00 100.000 mL/h via INTRAVENOUS

## 2021-12-15 MED ORDER — MIDODRINE 5 MG TABLET
5 mg | Status: AC
Start: 2021-12-15 — End: ?

## 2021-12-15 NOTE — Progress Notes
Pt cleared by Dr Daphene Jaeger for D1C2 of Atezolizumab and bevacizumab. Pt had previously been treated in [facility]. Pt was accompanied by his wife for visit today. Pt verbalizes weakness and requires a WC for distance and a walker for short distances. Pt tolerated treatment well through RT PIV. Reinforced to call office with any issues or symptom concerns. Pt will return on 01/07/2022 for next follow up.

## 2021-12-15 NOTE — Progress Notes
Re: Ryan Davies (November 13, 1950)MRN: [id number] Provider: Gevena Mart, MDDate of service: 4/3/2023REFERRING PROVIDER: No ref. provider foundREASON FOR VISIT: Follow-up VisitDIAGNOSIS:  HCC (hepatocellular carcinoma) (HC Code) (HC CODE) (HC Code)  (primary encounter diagnosis) HCC (hepatocellular carcinoma) (HC Code) Cancer Staging No matching staging information was found for the patient.Oncology History HCC (hepatocellular carcinoma) (HC Code) 11/04/2021 Initial Diagnosis  HCC (hepatocellular carcinoma) (HC Code) (HC CODE) (HC Code) 11/10/2021 -  Cancer Treatment  Plan name: OP Atezolizumab + BevacizumabPlan provider: Cresenciano Genre, MDStart date: 2/27/2023Line of treatment: D. First LineTreatment goal: Disease Control/Palliate SymptomsDiscontinued date: Ryan Davies is still active]Discontinued reason: Ryan Davies is still active] CURRENT CHEMOTHERAPY REGIMEN:ATEZO/BEV  Q3WC2D1  4/3/23CODE STATUS DNR/DNI 4/3/23HISTORY OF PRESENT ILLNESS:Ryan Davies is seen in follow up for the evaluation of hepatoma. He was seen in consultation by Dr Kathryne Gin. He was not felt to be a candidate for clinical trial. He received Cycle 1 atezolizumab/bevacizumab, 11/10/2021. This has been generally well tolerated without increased shortness of breath, diarrhea, hepatic dysfunction, increased edema, or increased skin rash. He was admitted to the hospital with Covid, from which he has recovered. 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 Initial attempt at catheter removal was unsuccessful as he had discontinued tamsulosin. This has been reinitiated. His appetite is somewhat improved. There is no significant shortness of breath. Pain has been well controlled without the need for narcotic analgesics. Peripheral edema has been stable. Review of Systems Constitutional: Negative for activity change, appetite change, chills, fatigue, fever and unexpected weight change.      Improved app HENT: Negative for congestion, ear pain, facial swelling and nosebleeds.  Eyes: Negative for discharge, redness and itching. Respiratory: Negative for cough, chest tightness, shortness of breath and wheezing.       Less SOB Cardiovascular: Negative for chest pain, palpitations and leg swelling. Gastrointestinal: Negative for abdominal distention, abdominal pain, anal bleeding, blood in stool, constipation, diarrhea, nausea and vomiting. Endocrine: Negative for cold intolerance and heat intolerance. Genitourinary: Negative for difficulty urinating.      Foley catheter. On flomax, urology f/u pending Musculoskeletal: Negative for neck pain. Allergic/Immunologic: Negative for environmental allergies. Neurological: Negative for dizziness, syncope, speech difficulty, weakness, numbness and headaches. Hematological: Negative for adenopathy. Does not bruise/bleed easily. Psychiatric/Behavioral: Negative for agitation and confusion. The patient is not nervous/anxious.  A comprehensive 12-point review of systems was queried and is otherwise negative.PAST MEDICAL HISTORY: Ryan Davies has a past medical history of Diabetes mellitus (HC Code), Hepatocellular carcinoma (HC Code) (HC CODE) (HC Code) (10/06/2021), Hypercholesteremia, Hypertension, and Peripheral vascular disease (HC Code) (HC CODE) (HC Code).PAST SURGICAL HISTORY: He has a past surgical history that includes Cataract Removal/IOL Implant (07/05/2021); Liver biopsy (10/24/2021); and EVISCERATION WITH ACRYLIC IMPLANT (07/22/2021).ALLERGIES: Environmental allergies MEDICATIONS: ?  albuterol sulfate, ?  coenzyme Q10, 10 mg, Oral, Daily?  escitalopram oxalate, 1 tablet, Oral, Daily?  insulin lispro, Inject under the skin 3 (three) times daily before meals.?  irbesartan-hydrochlorothiazide, 1 tablet, Oral, Daily?  latanoprost, 1 drop, Left Eye, Nightly?  LORazepam, 0.5 mg, Oral, Q6H PRN?  midodrine, ?  multivitamin, 1 capsule, Oral, Daily?  ondansetron, ?  oxyCODONE-acetaminophen, 1 tablet, Oral, Q4H PRN?  pantoprazole, ?  tamsulosin, 0.4 mg, Oral, Daily?  timoloL, 1 drop, Left Eye, BIDNo current facility-administered medications for this visit.SOCIAL HISTORY: Ryan Davies  reports that he quit smoking about 5 years ago. His smoking use included cigarettes. He has a 55.00 pack-year smoking history.  He uses smokeless tobacco. He reports current alcohol use of about 14.0 standard drinks per week. He reports that he does not use drugs.FAMILY HISTORY: His family history includes Cancer in his brother; Diabetes in his brother and mother; Heart disease in his brother, father, mother, and sister.PHYSICAL EXAM:Vitals:  12/15/21 1136 BP: (!) 135/59 Pulse: 68 Resp: 20 Temp: 98.6 ?F (37 ?C) Physical ExamConstitutional:     Appearance: He is well-developed.    Comments: Ill appearing gentleman. No acute distress. HENT:    Head: Normocephalic and atraumatic. Cardiovascular:    Rate and Rhythm: Normal rate and regular rhythm. Pulmonary:    Breath sounds: Normal breath sounds. Chest: Breasts:   Right: No mass.    Left: No mass. Abdominal:    General: Bowel sounds are normal. There is no distension.    Palpations: Abdomen is soft. There is no mass.    Tenderness: There is no abdominal tenderness. Musculoskeletal:       General: Normal range of motion.    Cervical back: Neck supple. Lymphadenopathy:    Upper Body:    Right upper body: No supraclavicular adenopathy.    Left upper body: No supraclavicular adenopathy. Skin:   General: Skin is warm and dry.    Findings: No ecchymosis, erythema, petechiae or rash. Neurological:    Mental Status: He is alert.    Sensory: No sensory deficit. Data Review:Lab Results Component Value Date  WBC 10.7 12/15/2021  HGB 11.8 (L) 12/15/2021  HCT 36.10 (L) 12/15/2021  MCV 88.9 12/15/2021  PLT 572 (H) 12/15/2021   Chemistry  Lab Results Component Value Date  NA 130 (L) 12/15/2021  K 5.2 (H) 12/15/2021  CL 96 (L) 12/15/2021  CO2 27 12/15/2021  BUN 20 12/15/2021  CREATININE 0.86 12/15/2021  GLU 201 (H) 12/15/2021  Lab Results Component Value Date  CALCIUM 8.6 (L) 12/15/2021  ALKPHOS 646 (H) 12/15/2021  AST 81 (H) 12/15/2021  ALT 46 12/15/2021  BILITOT 0.8 12/15/2021    [facility] [place] [facility] vascular in nature margins 7 centimetersComplex mass-upper abdomen displacing stomach anteriorly.  Cystic/solid components with calcification.  17 x 16 x 14 centimeters.  This is separate from the liverSpleen/pancreas/adrenals normalRetroperitoneum-no adenopathyImpression 17 centimeter upper abdominal mass displaces stomach posteriorly.  Separate from the liver.  Consider just tumor.  Innumerable hepatic lesions consistent with metastases IMPRESSION and PLAN: 1. Hepatoma: The patient has locally advanced disease. Initial treatment was not associated with dose limiting toxicities. His blood pressure is controlled. He will receive Cycle 2 chemotherapy. 2. Pain: Controlled. 3. Code Status: In the event of a cardiopulmonary arrest he does not wish aggressive resuscitation and is DNR/DNI, no code 3. 4. Hyponatremia: The patient has moderate hyponatremia. This is not symptomatic. The potassium is slightly elevated and will be followed. This may be due, in part, to the Midodrine. PLAN:1. Atezo/bev Cycle 2, Day 1.2. APRN 3 weeks, MD 6 weeks.  Scribed for Harlen Labs, MD by Beatrix Fetters, medical scribe April 3, 2023The documentation recorded by the scribe accurately reflects the services I personally performed and the decisions made by me. I reviewed and confirmed all material entered and/or pre-charted by the scribe.

## 2021-12-29 ENCOUNTER — Encounter: Payer: Self-pay | Admitting: Gastroenterology

## 2022-01-07 ENCOUNTER — Telehealth: Admit: 2022-01-07 | Payer: PRIVATE HEALTH INSURANCE | Attending: Hematology & Oncology | Primary: Family Medicine

## 2022-01-07 ENCOUNTER — Ambulatory Visit: Admit: 2022-01-07 | Payer: MEDICARE | Primary: Family Medicine

## 2022-01-07 ENCOUNTER — Ambulatory Visit: Admit: 2022-01-07 | Payer: MEDICARE | Attending: Surgical | Primary: Family Medicine

## 2022-01-07 NOTE — Telephone Encounter
Pt wife calling to cancel appts for today 01/06/22. 1:45 blood 2:00 Doctor 2:30 treatment . Pt is not feeling well enough to come in. Pt wife [name] can be reached at [phone] to reschedule the appt. For a later date

## 2022-01-07 NOTE — Telephone Encounter
Called and spoke with [name].  Patient not feeling well.  Feeling fatigued.  Denies dizziness, lightheadedness, sore throat, and cough.  Eating and drinking ok.  Julaine Fusi to notify office if any new symptoms develop and take patient to urgent care for evaluation.  Appointments to be rescheduled for 1 week.  Electronically Signed by Elsie Amis, RN, January 07, 2022

## 2022-01-12 ENCOUNTER — Telehealth: Admit: 2022-01-12 | Payer: PRIVATE HEALTH INSURANCE | Attending: Hematology & Oncology | Primary: Family Medicine

## 2022-01-12 ENCOUNTER — Ambulatory Visit: Admit: 2022-01-12 | Payer: MEDICARE | Primary: Family Medicine

## 2022-01-12 ENCOUNTER — Ambulatory Visit: Admit: 2022-01-12 | Payer: MEDICARE | Attending: Medical Oncology | Primary: Family Medicine

## 2022-01-12 NOTE — Telephone Encounter
TC with pt's wife [name] who states pt declines further treatment. She denies any adverse symptoms or side effects. They are cancelling appt today. Discussed will let MD know he declines further treatment. Reinforced importance to continue follow up with Dr Daphene Jaeger to continue to be evaluated for further care needs. Wife v/u and will keep 02/03/2022 appt. Wife was weepy with phone call. Offered MSW support and wife declined. Reinforced to call office with any issues or symptom concerns.

## 2022-01-12 NOTE — Telephone Encounter
Pt wife [name] calling to cancel appts. For today. Pt claims he wants to stop treatment completely and doesn't need to reschedule the appts. [name] can be reached at [phone] for any questions or concerns.

## 2022-01-15 ENCOUNTER — Ambulatory Visit: Payer: Medicare Other | Admitting: Gastroenterology

## 2022-01-26 ENCOUNTER — Ambulatory Visit: Admit: 2022-01-26 | Payer: MEDICARE | Attending: Hematology & Oncology | Primary: Family Medicine

## 2022-01-26 ENCOUNTER — Ambulatory Visit: Admit: 2022-01-26 | Payer: MEDICARE | Primary: Family Medicine

## 2022-02-03 ENCOUNTER — Ambulatory Visit: Admit: 2022-02-03 | Payer: MEDICARE | Attending: Hematology & Oncology | Primary: Family Medicine

## 2022-02-03 ENCOUNTER — Inpatient Hospital Stay: Admit: 2022-02-03 | Discharge: 2022-02-03 | Payer: MEDICARE | Primary: Family Medicine

## 2022-02-03 ENCOUNTER — Encounter: Admit: 2022-02-03 | Payer: PRIVATE HEALTH INSURANCE | Attending: Hematology & Oncology | Primary: Family Medicine

## 2022-02-03 ENCOUNTER — Ambulatory Visit: Admit: 2022-02-03 | Payer: MEDICARE | Primary: Family Medicine

## 2022-02-03 DIAGNOSIS — E119 Type 2 diabetes mellitus without complications: Secondary | ICD-10-CM

## 2022-02-03 DIAGNOSIS — Z87891 Personal history of nicotine dependence: Secondary | ICD-10-CM

## 2022-02-03 DIAGNOSIS — R188 Other ascites: Secondary | ICD-10-CM

## 2022-02-03 DIAGNOSIS — E871 Hypo-osmolality and hyponatremia: Secondary | ICD-10-CM

## 2022-02-03 DIAGNOSIS — R040 Epistaxis: Secondary | ICD-10-CM

## 2022-02-03 DIAGNOSIS — Z794 Long term (current) use of insulin: Secondary | ICD-10-CM

## 2022-02-03 DIAGNOSIS — Z79899 Other long term (current) drug therapy: Secondary | ICD-10-CM

## 2022-02-03 DIAGNOSIS — C220 Liver cell carcinoma: Secondary | ICD-10-CM

## 2022-02-03 DIAGNOSIS — E1151 Type 2 diabetes mellitus with diabetic peripheral angiopathy without gangrene: Secondary | ICD-10-CM

## 2022-02-03 DIAGNOSIS — Z5112 Encounter for antineoplastic immunotherapy: Secondary | ICD-10-CM

## 2022-02-03 DIAGNOSIS — Z833 Family history of diabetes mellitus: Secondary | ICD-10-CM

## 2022-02-03 DIAGNOSIS — I10 Essential (primary) hypertension: Secondary | ICD-10-CM

## 2022-02-03 DIAGNOSIS — Z8249 Family history of ischemic heart disease and other diseases of the circulatory system: Secondary | ICD-10-CM

## 2022-02-03 DIAGNOSIS — E7800 Pure hypercholesterolemia, unspecified: Secondary | ICD-10-CM

## 2022-02-03 DIAGNOSIS — I739 Peripheral vascular disease, unspecified: Secondary | ICD-10-CM

## 2022-02-03 LAB — CBC WITH AUTO DIFFERENTIAL
BKR ANION GAP: 31.8 g/dL (ref 31.0–36.0)
BKR WAM ABSOLUTE IMMATURE GRANULOCYTES.: 0.23 x 1000/??L (ref 0.00–0.30)
BKR WAM ABSOLUTE LYMPHOCYTE COUNT.: 1.22 x 1000/??L (ref 0.60–3.70)
BKR WAM ABSOLUTE NRBC (2 DEC): 0 x 1000/??L (ref 0.00–1.00)
BKR WAM ANALYZER ANC: 9.37 x 1000/??L — ABNORMAL HIGH (ref 2.00–7.60)
BKR WAM BASOPHILS: 0.7 % (ref 0.0–1.4)
BKR WAM EOSINOPHIL ABSOLUTE COUNT.: 0.3 x 1000/??L (ref 0.00–1.00)
BKR WAM EOSINOPHILS: 2.4 % — ABNORMAL LOW (ref 0.0–5.0)
BKR WAM HEMATOCRIT (2 DEC): 38.4 % — ABNORMAL LOW (ref 38.50–50.00)
BKR WAM HEMOGLOBIN: 12.2 g/dL — ABNORMAL LOW (ref 13.2–17.1)
BKR WAM IMMATURE GRANULOCYTES: 1.9 % — ABNORMAL HIGH (ref 0.0–1.0)
BKR WAM MCH (PG): 29.4 pg (ref 27.0–33.0)
BKR WAM MCHC: 31.8 g/dL (ref 31.0–36.0)
BKR WAM MCV: 92.5 fL (ref 80.0–100.0)
BKR WAM MONOCYTE ABSOLUTE COUNT.: 1.07 x 1000/??L — ABNORMAL HIGH (ref 0.00–1.00)
BKR WAM MONOCYTES: 8.7 % — ABNORMAL LOW (ref 4.0–12.0)
BKR WAM MPV: 8.4 fL (ref 8.0–12.0)
BKR WAM NEUTROPHILS: 76.4 % — ABNORMAL HIGH (ref 39.0–72.0)
BKR WAM NUCLEATED RED BLOOD CELLS: 0 % (ref 0.0–1.0)
BKR WAM PLATELETS: 518 x1000/??L — ABNORMAL HIGH (ref 150–420)
BKR WAM RDW-CV: 15.7 % — ABNORMAL HIGH (ref 11.0–15.0)
BKR WAM RED BLOOD CELL COUNT.: 4.15 M/??L (ref 4.00–6.00)
BKR WAM WHITE BLOOD CELL COUNT: 12.3 x1000/??L — ABNORMAL HIGH (ref 4.0–11.0)

## 2022-02-03 LAB — COMPREHENSIVE METABOLIC PANEL
BKR A/G RATIO: 1.1 x 1000/??L — ABNORMAL HIGH (ref 1.0–2.2)
BKR ALANINE AMINOTRANSFERASE (ALT): 57 U/L (ref 9–59)
BKR ALBUMIN: 2.8 g/dL — ABNORMAL LOW (ref 3.6–4.9)
BKR ALKALINE PHOSPHATASE: 476 U/L — ABNORMAL HIGH (ref 9–122)
BKR ASPARTATE AMINOTRANSFERASE (AST): 117 U/L — ABNORMAL HIGH (ref 10–35)
BKR AST/ALT RATIO: 2.1 x 1000/??L (ref 0.00–1.00)
BKR BILIRUBIN TOTAL: 1 mg/dL (ref 0.0–<=1.2)
BKR BLOOD UREA NITROGEN: 8 mg/dL — ABNORMAL HIGH (ref 8–23)
BKR BUN / CREAT RATIO: 9.3 (ref 8.0–23.0)
BKR CALCIUM: 8.2 mg/dL — ABNORMAL LOW (ref 8.8–10.2)
BKR CHLORIDE: 100 mmol/L (ref 98–107)
BKR CO2: 23 mmol/L (ref 20–30)
BKR CREATININE: 0.86 mg/dL (ref 0.40–1.30)
BKR EGFR, CREATININE (CKD-EPI 2021): >60 mL/min/{1.73_m2} (ref >=60)
BKR GLOBULIN: 2.6 g/dL (ref 2.3–3.5)
BKR GLUCOSE: 183 mg/dL — ABNORMAL HIGH (ref 70–100)
BKR POTASSIUM: 3.8 mmol/L (ref 3.3–5.1)
BKR PROTEIN TOTAL: 5.4 g/dL — ABNORMAL LOW (ref 6.6–8.7)
BKR SODIUM: 132 mmol/L — ABNORMAL LOW (ref 136–144)
BKR WAM BASOPHIL ABSOLUTE COUNT.: >60 mL/min/1.73m2 (ref >=60–1.00)
BKR WAM LYMPHOCYTES: 9.3 % — ABNORMAL LOW (ref 8.0–23.0)

## 2022-02-03 MED ORDER — ATEZOLIZUMAB (TECENTRIQ) 1200MG INFUSION
Freq: Once | INTRAVENOUS | Status: CP
Start: 2022-02-03 — End: ?
  Administered 2022-02-03: 18:00:00 250.000 mL/h via INTRAVENOUS

## 2022-02-03 MED ORDER — BEVACIZUMAB-AWWB INFUSION
Freq: Once | INTRAVENOUS | Status: CP
Start: 2022-02-03 — End: ?
  Administered 2022-02-03: 19:00:00 100.000 mL/h via INTRAVENOUS

## 2022-02-03 NOTE — Progress Notes
Re: Ryan Davies (12-05-1950)MRN: [id number] Provider: Gevena Mart, MDDate of service: 5/23/2023REFERRING PROVIDER: No ref. provider foundREASON FOR VISIT: No chief complaint on file.DIAGNOSIS:  HCC (hepatocellular carcinoma) (HC Code)  (primary encounter diagnosis) Cancer Staging No matching staging information was found for the patient.Oncology History HCC (hepatocellular carcinoma) (HC Code) 11/04/2021 Initial Diagnosis  HCC (hepatocellular carcinoma) (HC Code) (HC CODE) (HC Code) 11/10/2021 -  Cancer Treatment  Plan name: OP Atezolizumab + BevacizumabPlan provider: Cresenciano Genre, MDStart date: 2/27/2023Line of treatment: D. First LineTreatment goal: Disease Control/Palliate SymptomsDiscontinued date: Roosvelt Harps is still active]Discontinued reason: Roosvelt Harps is still active] CURRENT CHEMOTHERAPY REGIMEN:ATEZO/BEV  Q3WC2D1  4/3/23CODE STATUS DNR/DNI 4/3/23HISTORY OF PRESENT ILLNESS:Ryan Davies is seen in follow up for the evaluation of hepatoma. He has had a variable clinical course. Treatment with Atezo/bevacizumab has not been associated with acute symptomatic side effects. His appetite can be somewhat improved, depending on the day. He has intermittent epistaxis, which is chronic. He notes increasing ascites. Energy level is diminished. Performance status is diminished. He has a chronic indwelling Foley and failed removal. Lower extremity edema is somewhat improved.  Review of Systems Constitutional: Positive for fatigue. Negative for activity change, appetite change, chills, fever and unexpected weight change.      Improved app HENT: Negative for congestion, ear pain, facial swelling and nosebleeds.  Eyes: Negative for discharge, redness and itching. Respiratory: Negative for cough, chest tightness, shortness of breath and wheezing.       Less SOB Cardiovascular: Positive for leg swelling. Negative for chest pain and palpitations. Gastrointestinal: Positive for abdominal pain. Negative for abdominal distention, anal bleeding, blood in stool, constipation, diarrhea, nausea and vomiting. Endocrine: Negative for cold intolerance and heat intolerance. Genitourinary: Negative for difficulty urinating.      Foley catheter. On flomax, urology f/u pending Musculoskeletal: Negative for neck pain. Allergic/Immunologic: Negative for environmental allergies. Neurological: Negative for dizziness, syncope, speech difficulty, weakness, numbness and headaches. Hematological: Negative for adenopathy. Does not bruise/bleed easily. Psychiatric/Behavioral: Negative for agitation and confusion. The patient is not nervous/anxious.  A comprehensive 12-point review of systems was queried and is otherwise negative.PAST MEDICAL HISTORY: Jelan Batterton has a past medical history of Diabetes mellitus (HC Code), Hepatocellular carcinoma (HC Code) (10/06/2021), Hypercholesteremia, Hypertension, and Peripheral vascular disease (HC Code).PAST SURGICAL HISTORY: He has a past surgical history that includes Cataract Removal/IOL Implant (07/05/2021); Liver biopsy (10/24/2021); and EVISCERATION WITH ACRYLIC IMPLANT (07/22/2021).ALLERGIES: Environmental allergies MEDICATIONS: ?  albuterol sulfate, ?  coenzyme Q10, 10 mg, Oral, Daily?  escitalopram oxalate, 1 tablet, Oral, Daily?  insulin lispro, Inject under the skin 3 (three) times daily before meals.?  latanoprost, 1 drop, Left Eye, Nightly?  LORazepam, 0.5 mg, Oral, Q6H PRN?  midodrine, ?  multivitamin, 1 capsule, Oral, Daily?  ondansetron, ?  oxyCODONE-acetaminophen, 1 tablet, Oral, Q4H PRN?  pantoprazole, ?  tamsulosin, 0.4 mg, Oral, Daily?  timoloL, 1 drop, Left Eye, BIDNo current facility-administered medications for this visit.SOCIAL HISTORY: Mr. Saffran  reports that he quit smoking about 5 years ago. His smoking use included cigarettes. He has a 55.00 pack-year smoking history. He uses smokeless tobacco. He reports current alcohol use of about 14.0 standard drinks per week. He reports that he does not use drugs.FAMILY HISTORY: His family history includes Cancer in his brother; Diabetes in his brother and mother; Heart disease in his brother, father, mother, and sister.PHYSICAL EXAM:Vitals:  02/03/22 1255 BP: (!) 152/75 Pulse: 89 Resp: 20 Temp: 97.2 ?F (36.2 ?C) Physical ExamConstitutional:     Appearance: He is well-developed.  Comments: Ill appearing gentleman. No acute distress. HENT:    Head: Normocephalic and atraumatic. Cardiovascular:    Rate and Rhythm: Normal rate and regular rhythm. Pulmonary:    Breath sounds: Normal breath sounds. Chest: Breasts:   Right: No mass.    Left: No mass. Abdominal:    General: Bowel sounds are normal. There is no distension.    Palpations: Abdomen is soft. There is no mass.    Tenderness: There is no abdominal tenderness. Musculoskeletal:       General: Normal range of motion.    Cervical back: Neck supple. Lymphadenopathy:    Upper Body:    Right upper body: No supraclavicular adenopathy.    Left upper body: No supraclavicular adenopathy. Skin:   General: Skin is warm and dry.    Findings: No ecchymosis, erythema, petechiae or rash. Neurological:    Mental Status: He is alert.    Sensory: No sensory deficit. The Review of Systems and Physical Exam are otherwise unchanged from the previous encounter of 12/15/2021.Data Review:Lab Results Component Value Date  WBC 12.3 (H) 02/03/2022  HGB 12.2 (L) 02/03/2022  HCT 38.40 (L) 02/03/2022  MCV 92.5 02/03/2022  PLT 518 (H) 02/03/2022   Chemistry  Lab Results Component Value Date  NA 132 (L) 02/03/2022  K 3.8 02/03/2022  CL 100 02/03/2022  CO2 23 02/03/2022  BUN 8 02/03/2022  CREATININE 0.86 02/03/2022  GLU 183 (H) 02/03/2022  Lab Results Component Value Date  CALCIUM 8.2 (L) 02/03/2022  ALKPHOS 476 (H) 02/03/2022  AST 117 (H) 02/03/2022  ALT 57 02/03/2022  BILITOT 1.0 02/03/2022    [facility]20[facility] [place] [facility]innumerable metastatic lesions vascular in nature margins 7 centimetersComplex mass-upper abdomen displacing stomach anteriorly.  Cystic/solid components with calcification.  17 x 16 x 14 centimeters.  This is separate from the liverSpleen/pancreas/adrenals normalRetroperitoneum-no adenopathyImpression 17 centimeter upper abdominal mass displaces stomach posteriorly.  Separate from the liver.  Consider just tumor.  Innumerable hepatic lesions consistent with metastases IMPRESSION and PLAN: 1. Hepatoma: The patient has known metastatic disease. His performance status has been variable. He has considered stopping treatment, but feels better and wishes to maintain treatment. He will receive Cycle 3 Atezo/bevacizumab.2. Pain: Controlled.3. Hyponatremia: Stable. PLAN:1. Cycle 3 chemotherapy. 2. APRN 3 weeks, MD 6 weeks. 3. [place] scan prior to MD visit.   Scribed for Harlen Labs, MD by Beatrix Fetters, medical scribe May 23, 2023The documentation recorded by the scribe accurately reflects the services I personally performed and the decisions made by me. I reviewed and confirmed all material entered and/or pre-charted by the scribe.

## 2022-02-03 NOTE — Progress Notes
Pt cleared by Dr Daphene Jaeger for D1C3 of Atezolizumab and bevacizumab.  Pt was accompanied by his wife for visit today. Pt verbalizes weakness and requires a WC for distance and a walker for short distances. Pt tolerated treatment well through LT PIV. Reinforced to call office with any issues or symptom concerns. Pt will return on 02/23/2022 for next follow up.

## 2022-02-04 LAB — TSH W/REFLEX TO FT4     (BH GH LMW Q YH): BKR THYROID STIMULATING HORMONE: 1.94 ??IU/mL

## 2022-02-16 ENCOUNTER — Telehealth: Admit: 2022-02-16 | Payer: PRIVATE HEALTH INSURANCE | Attending: Medical Oncology | Primary: Family Medicine

## 2022-02-16 NOTE — Telephone Encounter
Pt wife calling to get results of the scans of his liver. She was unsure on whether or not she was supposed to contact us or the hospital for the results. Pt wife [name] can be reached at [phone]

## 2022-02-18 NOTE — Telephone Encounter
Faxed request to [facility] to obtain

## 2022-02-23 ENCOUNTER — Ambulatory Visit: Admit: 2022-02-23 | Payer: MEDICARE | Primary: Family Medicine

## 2022-02-23 ENCOUNTER — Ambulatory Visit: Admit: 2022-02-23 | Payer: MEDICARE | Attending: Medical Oncology | Primary: Family Medicine

## 2022-02-23 ENCOUNTER — Telehealth: Admit: 2022-02-23 | Payer: PRIVATE HEALTH INSURANCE | Attending: Medical Oncology | Primary: Family Medicine

## 2022-02-23 ENCOUNTER — Encounter: Admit: 2022-02-23 | Payer: PRIVATE HEALTH INSURANCE | Attending: Medical Oncology | Primary: Family Medicine

## 2022-02-23 ENCOUNTER — Encounter
Admit: 2022-02-23 | Payer: PRIVATE HEALTH INSURANCE | Attending: Vascular and Interventional Radiology | Primary: Family Medicine

## 2022-02-23 DIAGNOSIS — C220 Liver cell carcinoma: Secondary | ICD-10-CM

## 2022-02-23 MED ORDER — ATEZOLIZUMAB (TECENTRIQ) 1200MG INFUSION
Freq: Once | INTRAVENOUS | Status: DC
Start: 2022-02-23 — End: 2022-02-23

## 2022-02-23 NOTE — Telephone Encounter
TC with pt's wife who states pt is too weak to come in. Pt's wife attempted to encourage. Vague symptoms but he is eating and drinking in small amounts. Wife requested to reschedule next week.

## 2022-02-23 NOTE — Telephone Encounter
Patient wife is calling to cancel all the appts for today, she would like to reschedule

## 2022-02-23 NOTE — Telephone Encounter
Per wife patient is feeling weak today

## 2022-03-03 ENCOUNTER — Telehealth: Admit: 2022-03-03 | Payer: PRIVATE HEALTH INSURANCE | Attending: Hematology & Oncology | Primary: Family Medicine

## 2022-03-03 NOTE — Telephone Encounter
Spoke with [name]. Explained that we will cancel the appointment on 7/5 and r/s treatment out 3 weeks from next treatment which is scheduled for 03/13/22. Marland KitchenElectronically Signed by Peggye Pitt, RN, March 03, 2022

## 2022-03-03 NOTE — Telephone Encounter
PT wife calling to figure out why the pt has treatment so close to each other if its supposed to be every 3 weeks. Please give [name] a call at [phone]

## 2022-03-11 DIAGNOSIS — H401131 Primary open-angle glaucoma, bilateral, mild stage: Secondary | ICD-10-CM | POA: Diagnosis not present

## 2022-03-13 ENCOUNTER — Telehealth: Admit: 2022-03-13 | Payer: PRIVATE HEALTH INSURANCE | Attending: Medical Oncology | Primary: Family Medicine

## 2022-03-13 ENCOUNTER — Inpatient Hospital Stay: Admit: 2022-03-13 | Payer: PRIVATE HEALTH INSURANCE | Primary: Family Medicine

## 2022-03-13 ENCOUNTER — Ambulatory Visit: Admit: 2022-03-13 | Payer: MEDICARE | Attending: Medical Oncology | Primary: Family Medicine

## 2022-03-13 ENCOUNTER — Ambulatory Visit: Admit: 2022-03-13 | Payer: MEDICARE | Primary: Family Medicine

## 2022-03-13 NOTE — Telephone Encounter
[name] would like to inform us the patient woke up not feeling well to come in for his appt and treatment. [name] would like to know if he is able to reschedule his treatment or just wait until he come for July 19

## 2022-03-13 NOTE — Telephone Encounter
TC with pt's wife who states pt is too weak to leave home today. No acute symptom concerns. They also state [facility] does not have order for Stanley scan. Request to front office to reschedule appt and re-fax orders to [facility].

## 2022-03-18 ENCOUNTER — Ambulatory Visit: Admit: 2022-03-18 | Payer: MEDICARE | Primary: Family Medicine

## 2022-03-18 ENCOUNTER — Ambulatory Visit: Admit: 2022-03-18 | Payer: MEDICARE | Attending: Hematology & Oncology | Primary: Family Medicine

## 2022-04-01 ENCOUNTER — Ambulatory Visit: Admit: 2022-04-01 | Payer: MEDICARE | Primary: Family Medicine

## 2022-04-01 ENCOUNTER — Telehealth: Admit: 2022-04-01 | Payer: PRIVATE HEALTH INSURANCE | Attending: Hematology & Oncology | Primary: Family Medicine

## 2022-04-01 ENCOUNTER — Ambulatory Visit: Admit: 2022-04-01 | Payer: MEDICARE | Attending: Hematology & Oncology | Primary: Family Medicine

## 2022-04-01 NOTE — Telephone Encounter
2 attempts to TC pt to follow up on condition and offer home care or hospice services per Dr Daphene Jaeger. Instructed on VM to wife  to call back when able for guidance.

## 2022-04-01 NOTE — Telephone Encounter
Patient is very weak diarrhea. Wife has to lift him up. It is on and off for a couple off weeks.  Wife is concerned and of course canceling today's appointment.

## 2022-04-14 ENCOUNTER — Telehealth: Admit: 2022-04-14 | Payer: PRIVATE HEALTH INSURANCE | Attending: Hematology & Oncology | Primary: Family Medicine

## 2022-04-14 NOTE — Telephone Encounter
Per wife patient passed away on 04-13-23

## 2022-04-14 DEATH — deceased

## 2022-05-20 ENCOUNTER — Telehealth: Payer: Self-pay | Admitting: Family Medicine

## 2022-05-20 DIAGNOSIS — N529 Male erectile dysfunction, unspecified: Secondary | ICD-10-CM

## 2022-05-20 MED ORDER — TADALAFIL 20 MG PO TABS: 20.0000 mg | ORAL_TABLET | Freq: Every day | ORAL | 5 refills | Status: DC | PRN

## 2022-05-20 NOTE — Telephone Encounter (Signed)
Pt called and is requesting a refill on his cialis please send to the [facility] ([place]), [place] - [address]

## 2022-07-01 DIAGNOSIS — H52223 Regular astigmatism, bilateral: Secondary | ICD-10-CM | POA: Diagnosis not present

## 2022-07-01 DIAGNOSIS — H5203 Hypermetropia, bilateral: Secondary | ICD-10-CM | POA: Diagnosis not present

## 2022-07-01 DIAGNOSIS — H401130 Primary open-angle glaucoma, bilateral, stage unspecified: Secondary | ICD-10-CM | POA: Diagnosis not present

## 2022-07-01 DIAGNOSIS — H524 Presbyopia: Secondary | ICD-10-CM | POA: Diagnosis not present

## 2022-07-01 DIAGNOSIS — H259 Unspecified age-related cataract: Secondary | ICD-10-CM | POA: Diagnosis not present

## 2022-09-10 DIAGNOSIS — H401131 Primary open-angle glaucoma, bilateral, mild stage: Secondary | ICD-10-CM | POA: Diagnosis not present

## 2022-10-12 ENCOUNTER — Ambulatory Visit: Payer: Medicare Other | Admitting: Family Medicine

## 2022-10-13 ENCOUNTER — Ambulatory Visit (INDEPENDENT_AMBULATORY_CARE_PROVIDER_SITE_OTHER): Payer: Medicare HMO

## 2022-10-13 VITALS — Ht 68.0 in | Wt 144.0 lb

## 2022-10-13 DIAGNOSIS — Z0000 Encounter for general adult medical examination without abnormal findings: Secondary | ICD-10-CM

## 2022-10-13 NOTE — Patient Instructions (Signed)
Phillip Ryan , Thank you for taking time to come for your Medicare Wellness Visit. I appreciate your ongoing commitment to your health goals. Please review the following plan we discussed and let me know if I can assist you in the future.   These are the goals we discussed:  Goals      Patient Stated     10/13/2022, no goals        This is a list of the screening recommended for you and due dates:  Health Maintenance  Topic Date Due   Flu Shot  04/14/2022   COVID-19 Vaccine (5 - 2023-24 season) 05/15/2022   Medicare Annual Wellness Visit  10/14/2023   Colon Cancer Screening  12/10/2024   DTaP/Tdap/Td vaccine (3 - Td or Tdap) 11/02/2031   Pneumonia Vaccine  Completed   Hepatitis C Screening: USPSTF Recommendation to screen - Ages 51-79 yo.  Completed   Zoster (Shingles) Vaccine  Completed   HPV Vaccine  Aged Out    Advanced directives: Advance directive discussed with you today.   Conditions/risks identified: none  Next appointment: Follow up in one year for your annual wellness visit.   Preventive Care 27 Years and Older, Male  Preventive care refers to lifestyle choices and visits with your health care provider that can promote health and wellness. What does preventive care include? A yearly physical exam. This is also called an annual well check. Dental exams once or twice a year. Routine eye exams. Ask your health care provider how often you should have your eyes checked. Personal lifestyle choices, including: Daily care of your teeth and gums. Regular physical activity. Eating a healthy diet. Avoiding tobacco and drug use. Limiting alcohol use. Practicing safe sex. Taking low doses of aspirin every day. Taking vitamin and mineral supplements as recommended by your health care provider. What happens during an annual well check? The services and screenings done by your health care provider during your annual well check will depend on your age, overall health,  lifestyle risk factors, and family history of disease. Counseling  Your health care provider may ask you questions about your: Alcohol use. Tobacco use. Drug use. Emotional well-being. Home and relationship well-being. Sexual activity. Eating habits. History of falls. Memory and ability to understand (cognition). Work and work Statistician. Screening  You may have the following tests or measurements: Height, weight, and BMI. Blood pressure. Lipid and cholesterol levels. These may be checked every 5 years, or more frequently if you are over 59 years old. Skin check. Lung cancer screening. You may have this screening every year starting at age 24 if you have a 30-pack-year history of smoking and currently smoke or have quit within the past 15 years. Fecal occult blood test (FOBT) of the stool. You may have this test every year starting at age 42. Flexible sigmoidoscopy or colonoscopy. You may have a sigmoidoscopy every 5 years or a colonoscopy every 10 years starting at age 38. Prostate cancer screening. Recommendations will vary depending on your family history and other risks. Hepatitis C blood test. Hepatitis B blood test. Sexually transmitted disease (STD) testing. Diabetes screening. This is done by checking your blood sugar (glucose) after you have not eaten for a while (fasting). You may have this done every 1-3 years. Abdominal aortic aneurysm (AAA) screening. You may need this if you are a current or former smoker. Osteoporosis. You may be screened starting at age 22 if you are at high risk. Talk with your health care provider  about your test results, treatment options, and if necessary, the need for more tests. Vaccines  Your health care provider may recommend certain vaccines, such as: Influenza vaccine. This is recommended every year. Tetanus, diphtheria, and acellular pertussis (Tdap, Td) vaccine. You may need a Td booster every 10 years. Zoster vaccine. You may need this  after age 5. Pneumococcal 13-valent conjugate (PCV13) vaccine. One dose is recommended after age 59. Pneumococcal polysaccharide (PPSV23) vaccine. One dose is recommended after age 75. Talk to your health care provider about which screenings and vaccines you need and how often you need them. This information is not intended to replace advice given to you by your health care provider. Make sure you discuss any questions you have with your health care provider. Document Released: 09/27/2015 Document Revised: 05/20/2016 Document Reviewed: 07/02/2015 Elsevier Interactive Patient Education  2017 [place] Prevention in the Home Falls can cause injuries. They can happen to people of all ages. There are many things you can do to make your home safe and to help prevent falls. What can I do on the outside of my home? Regularly fix the edges of walkways and driveways and fix any cracks. Remove anything that might make you trip as you walk through a door, such as a raised step or threshold. Trim any bushes or trees on the path to your home. Use bright outdoor lighting. Clear any walking paths of anything that might make someone trip, such as rocks or tools. Regularly check to see if handrails are loose or broken. Make sure that both sides of any steps have handrails. Any raised decks and porches should have guardrails on the edges. Have any leaves, snow, or ice cleared regularly. Use sand or salt on walking paths during winter. Clean up any spills in your garage right away. This includes oil or grease spills. What can I do in the bathroom? Use night lights. Install grab bars by the toilet and in the tub and shower. Do not use towel bars as grab bars. Use non-skid mats or decals in the tub or shower. If you need to sit down in the shower, use a plastic, non-slip stool. Keep the floor dry. Clean up any water that spills on the floor as soon as it happens. Remove soap buildup in the tub or  shower regularly. Attach bath mats securely with double-sided non-slip rug tape. Do not have throw rugs and other things on the floor that can make you trip. What can I do in the bedroom? Use night lights. Make sure that you have a light by your bed that is easy to reach. Do not use any sheets or blankets that are too big for your bed. They should not hang down onto the floor. Have a firm chair that has side arms. You can use this for support while you get dressed. Do not have throw rugs and other things on the floor that can make you trip. What can I do in the kitchen? Clean up any spills right away. Avoid walking on wet floors. Keep items that you use a lot in easy-to-reach places. If you need to reach something above you, use a strong step stool that has a grab bar. Keep electrical cords out of the way. Do not use floor polish or wax that makes floors slippery. If you must use wax, use non-skid floor wax. Do not have throw rugs and other things on the floor that can make you trip. What can I do  with my stairs? Do not leave any items on the stairs. Make sure that there are handrails on both sides of the stairs and use them. Fix handrails that are broken or loose. Make sure that handrails are as long as the stairways. Check any carpeting to make sure that it is firmly attached to the stairs. Fix any carpet that is loose or worn. Avoid having throw rugs at the top or bottom of the stairs. If you do have throw rugs, attach them to the floor with carpet tape. Make sure that you have a light switch at the top of the stairs and the bottom of the stairs. If you do not have them, ask someone to add them for you. What else can I do to help prevent falls? Wear shoes that: Do not have high heels. Have rubber bottoms. Are comfortable and fit you well. Are closed at the toe. Do not wear sandals. If you use a stepladder: Make sure that it is fully opened. Do not climb a closed stepladder. Make  sure that both sides of the stepladder are locked into place. Ask someone to hold it for you, if possible. Clearly mark and make sure that you can see: Any grab bars or handrails. First and last steps. Where the edge of each step is. Use tools that help you move around (mobility aids) if they are needed. These include: Canes. Walkers. Scooters. Crutches. Turn on the lights when you go into a dark area. Replace any light bulbs as soon as they burn out. Set up your furniture so you have a clear path. Avoid moving your furniture around. If any of your floors are uneven, fix them. If there are any pets around you, be aware of where they are. Review your medicines with your doctor. Some medicines can make you feel dizzy. This can increase your chance of falling. Ask your doctor what other things that you can do to help prevent falls. This information is not intended to replace advice given to you by your health care provider. Make sure you discuss any questions you have with your health care provider. Document Released: 06/27/2009 Document Revised: 02/06/2016 Document Reviewed: 10/05/2014 Elsevier Interactive Patient Education  2017 [employer].

## 2022-10-13 NOTE — Progress Notes (Signed)
I connected with Cyndie Mull today by telephone and verified that I am speaking with the correct person using two identifiers. Location patient: home Location provider: work Persons participating in the virtual visit: Phillip Ryan, Glenna Durand LPN.   I discussed the limitations, risks, security and privacy concerns of performing an evaluation and management service by telephone and the availability of in person appointments. I also discussed with the patient that there may be a patient responsible charge related to this service. The patient expressed understanding and verbally consented to this telephonic visit.    Interactive audio and video telecommunications were attempted between this provider and patient, however failed, due to patient having technical difficulties OR patient did not have access to video capability.  We continued and completed visit with audio only.     Vital signs may be patient reported or missing.  Subjective:   Phillip Ryan is a 72 y.o. male who presents for Medicare Annual/Subsequent preventive examination.  Review of Systems     Cardiac Risk Factors include: advanced age (>54mn, >>62women);male gender     Objective:    Today's Vitals   10/13/22 0841  Weight: 144 lb (65.3 kg)  Height: 5' 8"  (1.727 m)   Body mass index is 21.9 kg/m.     10/13/2022    8:45 AM 10/09/2021    1:54 PM 10/08/2020    1:58 PM 10/21/2018    1:39 PM 04/16/2015    2:38 PM  Advanced Directives  Does Patient Have a Medical Advance Directive? No No No No Yes  Type of Advance Directive     [place] Does patient want to make changes to medical advance directive?     No - Patient declined  Copy of [place] Chart?     No - copy requested  Would patient like information on creating a medical advance directive?  Yes (ED - Information included in AVS) Yes (MAU/Ambulatory/Procedural Areas - Information given) No - Patient declined No  - patient declined information    Current Medications (verified) Outpatient Encounter Medications as of 10/13/2022  Medication Sig   dorzolamide-timolol (COSOPT) 22.3-6.8 MG/ML ophthalmic solution 1 drop 2 (two) times daily.   latanoprost (XALATAN) 0.005 % ophthalmic solution SMARTSIG:1 Drop(s) In Eye(s) Every Evening   tadalafil (CIALIS) 20 MG tablet Take 1 tablet (20 mg total) by mouth daily as needed for erectile dysfunction.   No facility-administered encounter medications on file as of 10/13/2022.    Allergies (verified) Patient has no known allergies.   History: Past Medical History:  Diagnosis Date   Glaucoma    uses eye drops   Pleural effusion    Tubular adenoma of colon 05/2008   Past Surgical History:  Procedure Laterality Date   CERVICAL FUSION  2009   COLONOSCOPY  2020   MS-MAC-suprep(good)-hems/5 polyps/TA x 5-recall 3 yr   Family History  Problem Relation Age of Onset   Colon cancer Neg Hx    Colon polyps Neg Hx    Esophageal cancer Neg Hx    Rectal cancer Neg Hx    Stomach cancer Neg Hx    Social History   Socioeconomic History   Marital status: Married    Spouse name: Not on file   Number of children: Not on file   Years of education: Not on file   Highest education level: Not on file  Occupational History   Not on file  Tobacco Use   Smoking status:  Former   Smokeless tobacco: Never  Scientific laboratory technician Use: Never used  Substance and Sexual Activity   Alcohol use: Yes    Alcohol/week: 1.0 - 3.0 standard drink of alcohol    Types: 1 - 3 Standard drinks or equivalent per week   Drug use: No   Sexual activity: Yes  Other Topics Concern   Not on file  Social History Narrative   Not on file   Social Determinants of Health   Financial Resource Strain: Low Risk  (10/13/2022)   Overall Financial Resource Strain (CARDIA)    Difficulty of Paying Living Expenses: Not hard at all  Food Insecurity: No Food Insecurity (10/13/2022)   Hunger Vital  Sign    Worried About Running Out of Food in the Last Year: Never true    Ran Out of Food in the Last Year: Never true  Transportation Needs: No Transportation Needs (10/13/2022)   PRAPARE - Hydrologist (Medical): No    Lack of Transportation (Non-Medical): No  Physical Activity: Sufficiently Active (10/13/2022)   Exercise Vital Sign    Days of Exercise per Week: 4 days    Minutes of Exercise per Session: 50 min  Stress: No Stress Concern Present (10/13/2022)   [place]    Feeling of Stress : Not at all  Social Connections: Not on file    Tobacco Counseling Counseling given: Not Answered   Clinical Intake:  Pre-visit preparation completed: Yes  Pain : No/denies pain     Nutritional Status: BMI of 19-24  Normal Nutritional Risks: None Diabetes: No  How often do you need to have someone help you when you read instructions, pamphlets, or other written materials from your doctor or pharmacy?: 1 - Never  Diabetic? no  Interpreter Needed?: No  Information entered by :: NAllen LPN   Activities of Daily Living    10/13/2022    8:47 AM  In your present state of health, do you have any difficulty performing the following activities:  Hearing? 0  Vision? 0  Difficulty concentrating or making decisions? 0  Walking or climbing stairs? 0  Dressing or bathing? 0  Doing errands, shopping? 0  Preparing Food and eating ? N  Using the Toilet? N  In the past six months, have you accidently leaked urine? N  Do you have problems with loss of bowel control? N  Managing your Medications? N  Managing your Finances? N  Housekeeping or managing your Housekeeping? N    Patient Care Team: Denita Lung, MD as PCP - General (Family Medicine)  Indicate any recent Medical Services you may have received from other than [facility] providers in the past year (date may be approximate).      Assessment:   This is a routine wellness examination for Phillip Ryan.  Hearing/Vision screen Vision Screening - Comments:: No regular eye exams, Dr. Brigitte Pulse  Dietary issues and exercise activities discussed: Current Exercise Habits: Home exercise routine, Type of exercise: walking, Time (Minutes): 45, Frequency (Times/Week): 4, Weekly Exercise (Minutes/Week): 180   Goals Addressed             This Visit's Progress    Patient Stated       10/13/2022, no goals       Depression Screen    10/13/2022    8:47 AM 10/09/2021    1:50 PM 10/08/2020    1:58 PM 06/25/2020   10:28 AM  09/16/2018    3:24 PM 10/06/2016    2:12 PM 11/21/2014    2:17 PM  PHQ 2/9 Scores  PHQ - 2 Score 0 0 0 0 0 0 0  PHQ- 9 Score 0          Fall Risk    10/13/2022    9:03 AM 10/09/2021    1:49 PM 10/08/2020    1:58 PM 06/25/2020   10:28 AM 09/16/2018    3:24 PM  [place] in the past year? 0 0 0 0 0  Number falls in past yr: 0 1     Injury with Fall? 0 0     Risk for fall due to : Medication side effect No Fall Risks     Follow up Falls prevention discussed;Education provided;Falls evaluation completed Falls evaluation completed       FALL RISK PREVENTION PERTAINING TO THE HOME:  Any stairs in or around the home? No  If so, are there any without handrails? N/a Home free of loose throw rugs in walkways, pet beds, electrical cords, etc? Yes  Adequate lighting in your home to reduce risk of falls? Yes   ASSISTIVE DEVICES UTILIZED TO PREVENT FALLS:  Life alert? No  Use of a cane, walker or w/c? No  Grab bars in the bathroom? No  Shower chair or bench in shower? No  Elevated toilet seat or a handicapped toilet? No   TIMED UP AND GO:  Was the test performed? No .      Cognitive Function:        10/13/2022    8:49 AM  6CIT Screen  What Year? 0 points  What month? 0 points  What time? 0 points  Count back from 20 0 points  Months in reverse 0 points  Repeat phrase 0 points  Total Score 0  points    Immunizations Immunization History  Administered Date(s) Administered   Fluad Quad(high Dose 65+) 06/25/2020, 10/09/2021   PFIZER(Purple Top)SARS-COV-2 Vaccination 11/10/2019, 12/01/2019, 06/25/2020   Pfizer Covid-19 Vaccine Bivalent Booster 56yr & up 10/09/2021   Pneumococcal Conjugate-13 11/21/2014   Pneumococcal Polysaccharide-23 10/06/2016   Tdap 08/09/2011, 11/01/2021   Zoster Recombinat (Shingrix) 11/01/2021, 01/10/2022    TDAP status: Up to date  Flu Vaccine status: Due, Education has been provided regarding the importance of this vaccine. Advised may receive this vaccine at local pharmacy or Health Dept. Aware to provide a copy of the vaccination record if obtained from local pharmacy or Health Dept. Verbalized acceptance and understanding.  Pneumococcal vaccine status: Up to date  Covid-19 vaccine status: Completed vaccines  Qualifies for Shingles Vaccine? Yes   Zostavax completed Yes   Shingrix Completed?: Yes  Screening Tests Health Maintenance  Topic Date Due   INFLUENZA VACCINE  04/14/2022   COVID-19 Vaccine (5 - 2023-24 season) 05/15/2022   Medicare Annual Wellness (AWV)  10/14/2023   COLONOSCOPY (Pts 45-462yrInsurance coverage will need to be confirmed)  12/10/2024   DTaP/Tdap/Td (3 - Td or Tdap) 11/02/2031   Pneumonia Vaccine 6530Years old  Completed   Hepatitis C Screening  Completed   Zoster Vaccines- Shingrix  Completed   HPV VACCINES  Aged Out    Health Maintenance  Health Maintenance Due  Topic Date Due   INFLUENZA VACCINE  04/14/2022   COVID-19 Vaccine (5 - 2023-24 season) 05/15/2022    Colorectal cancer screening: Type of screening: Colonoscopy. Completed 12/10/2021. Repeat every 3 years  Lung Cancer Screening: (Low Dose  CT Chest recommended if Age 55-80 years, 30 pack-year currently smoking OR have quit w/in 15years.) does not qualify.   Lung Cancer Screening Referral: no  Additional Screening:  Hepatitis C Screening: does  qualify; Completed 09/16/2018  Vision Screening: Recommended annual ophthalmology exams for early detection of glaucoma and other disorders of the eye. Is the patient up to date with their annual eye exam?  Yes  Who is the provider or what is the name of the office in which the patient attends annual eye exams? Dr. Manuella Ghazi If pt is not established with a provider, would they like to be referred to a provider to establish care? No .   Dental Screening: Recommended annual dental exams for proper oral hygiene  Community Resource Referral / Chronic Care Management: CRR required this visit?  No   CCM required this visit?  No      Plan:     I have personally reviewed and noted the following in the patient's chart:   Medical and social history Use of alcohol, tobacco or illicit drugs  Current medications and supplements including opioid prescriptions. Patient is not currently taking opioid prescriptions. Functional ability and status Nutritional status Physical activity Advanced directives List of other physicians Hospitalizations, surgeries, and ER visits in previous 12 months Vitals Screenings to include cognitive, depression, and falls Referrals and appointments  In addition, I have reviewed and discussed with patient certain preventive protocols, quality metrics, and best practice recommendations. A written personalized care plan for preventive services as well as general preventive health recommendations were provided to patient.     Kellie Simmering, LPN   [id number]   Nurse Notes: none  Due to this being a virtual visit, the after visit summary with patients personalized plan was offered to patient via mail or my-chart. to pick up at office at next visit

## 2022-10-21 ENCOUNTER — Ambulatory Visit (INDEPENDENT_AMBULATORY_CARE_PROVIDER_SITE_OTHER): Payer: Medicare HMO | Admitting: Family Medicine

## 2022-10-21 ENCOUNTER — Encounter: Payer: Self-pay | Admitting: Family Medicine

## 2022-10-21 VITALS — BP 160/82 | HR 56 | Temp 97.7°F | Resp 18 | Ht 67.25 in | Wt 133.4 lb

## 2022-10-21 DIAGNOSIS — Z86010 Personal history of colonic polyps: Secondary | ICD-10-CM | POA: Diagnosis not present

## 2022-10-21 DIAGNOSIS — N529 Male erectile dysfunction, unspecified: Secondary | ICD-10-CM

## 2022-10-21 DIAGNOSIS — Z23 Encounter for immunization: Secondary | ICD-10-CM | POA: Diagnosis not present

## 2022-10-21 DIAGNOSIS — H4010X0 Unspecified open-angle glaucoma, stage unspecified: Secondary | ICD-10-CM | POA: Diagnosis not present

## 2022-10-21 DIAGNOSIS — Z0000 Encounter for general adult medical examination without abnormal findings: Secondary | ICD-10-CM

## 2022-10-21 DIAGNOSIS — Z13220 Encounter for screening for lipoid disorders: Secondary | ICD-10-CM | POA: Diagnosis not present

## 2022-10-21 DIAGNOSIS — I739 Peripheral vascular disease, unspecified: Secondary | ICD-10-CM | POA: Diagnosis not present

## 2022-10-21 DIAGNOSIS — R030 Elevated blood-pressure reading, without diagnosis of hypertension: Secondary | ICD-10-CM | POA: Diagnosis not present

## 2022-10-21 MED ORDER — TADALAFIL 20 MG PO TABS: 20.0000 mg | ORAL_TABLET | Freq: Every day | ORAL | 5 refills | Status: DC | PRN

## 2022-10-21 NOTE — Progress Notes (Signed)
Complete physical exam  Patient: Phillip Ryan   DOB: 1951-07-25   72 y.o. Male  MRN: [id number]  Subjective:    Chief Complaint  Patient presents with   Annual Exam    Had AWV with [facility] on 10/13/2022. No additional concerns.     Phillip Ryan is a 72 y.o. male who presents today for a complete physical exam. He reports consuming a general diet.  He generally feels fairly well. He reports sleeping fairly well. He does not have additional problems to discuss today.  Social and family history as well as health maintenance and immunizations was reviewed.   Most recent fall risk assessment:    10/13/2022    9:03 AM  Joseph in the past year? 0  Number falls in past yr: 0  Injury with Fall? 0  Risk for fall due to : Medication side effect  Follow up Falls prevention discussed;Education provided;Falls evaluation completed     Most recent depression screenings:    10/13/2022    8:47 AM 10/09/2021    1:50 PM  PHQ 2/9 Scores  PHQ - 2 Score 0 0  PHQ- 9 Score 0     Vision:Within last year and Dental: Receives regular dental care    Patient Care Team: Denita Lung, MD as PCP - General (Family Medicine)   Outpatient Medications Prior to Visit  Medication Sig   dorzolamide-timolol (COSOPT) 22.3-6.8 MG/ML ophthalmic solution 1 drop 2 (two) times daily.   [DISCONTINUED] tadalafil (CIALIS) 20 MG tablet Take 1 tablet (20 mg total) by mouth daily as needed for erectile dysfunction.   [DISCONTINUED] latanoprost (XALATAN) 0.005 % ophthalmic solution SMARTSIG:1 Drop(s) In Eye(s) Every Evening   No facility-administered medications prior to visit.    Review of Systems  All other systems reviewed and are negative.         Objective:     BP (!) 160/82 (BP Location: Right Arm, Cuff Size: Normal)   Pulse (!) 56   Temp 97.7 F (36.5 C) (Tympanic)   Resp 18   Ht 5' 7.25" (1.708 m)   Wt 133 lb 6.4 oz (60.5 kg)   SpO2 99% Comment: room air  BMI 20.74 kg/m     Physical Exam  Alert and in no distress. Tympanic membranes and canals are normal. Pharyngeal area is normal. Neck is supple without adenopathy or thyromegaly. Cardiac exam shows a regular sinus rhythm without murmurs or gallops. Lungs are clear to auscultation.      Assessment & Plan:    Routine general medical examination at a health care facility - Plan: CBC with Differential/Platelet, Comprehensive metabolic panel, Lipid panel  Erectile dysfunction, unspecified erectile dysfunction type - Plan: tadalafil (CIALIS) 20 MG tablet  History of colonic polyps  PAD (peripheral artery disease) ([place]) - Plan: CBC with Differential/Platelet, Comprehensive metabolic panel, Lipid panel  Elevated blood pressure reading  Open-angle glaucoma, unspecified glaucoma stage, unspecified laterality, unspecified open-angle glaucoma type  Need for COVID-19 vaccine - Plan: Pfizer Fall 2023 Covid-19 Vaccine 14yr and older  Need for influenza vaccination - Plan: Flu vaccine HIGH DOSE PF (Fluzone High dose)  Screening for lipid disorders - Plan: Lipid panel  Immunization History  Administered Date(s) Administered   Fluad Quad(high Dose 65+) 06/25/2020, 10/09/2021   PFIZER(Purple Top)SARS-COV-2 Vaccination 11/10/2019, 12/01/2019, 06/25/2020   Pfizer Covid-19 Vaccine Bivalent Booster 136yr& up 10/09/2021   Pneumococcal Conjugate-13 11/21/2014   Pneumococcal Polysaccharide-23 10/06/2016   Tdap 08/09/2011, 11/01/2021  Zoster Recombinat (Shingrix) 11/01/2021, 01/10/2022    Health Maintenance  Topic Date Due   INFLUENZA VACCINE  04/14/2022   COVID-19 Vaccine (5 - 2023-24 season) 05/15/2022   Medicare Annual Wellness (AWV)  10/14/2023   COLONOSCOPY (Pts 45-14yr Insurance coverage will need to be confirmed)  12/10/2024   DTaP/Tdap/Td (3 - Td or Tdap) 11/02/2031   Pneumonia Vaccine 72 Years old  Completed   Hepatitis C Screening  Completed   Zoster Vaccines- Shingrix  Completed   HPV VACCINES   Aged Out    Discussed his blood pressure and he is to return here in 1 month for recheck on that.  Might possibly need to be placed on the medication.  Encouraged him to cut back on his alcohol consumption to 1 or 2/day.  Problem List Items Addressed This Visit     Erectile dysfunction   Relevant Medications   tadalafil (CIALIS) 20 MG tablet   History of colonic polyps   Other Visit Diagnoses     Routine general medical examination at a health care facility    -  Primary   Relevant Orders   CBC with Differential/Platelet   Comprehensive metabolic panel   Lipid panel   PAD (peripheral artery disease) ([facility])       Relevant Medications   tadalafil (CIALIS) 20 MG tablet   Other Relevant Orders   CBC with Differential/Platelet   Comprehensive metabolic panel   Lipid panel   Elevated blood pressure reading       Open-angle glaucoma, unspecified glaucoma stage, unspecified laterality, unspecified open-angle glaucoma type       Need for COVID-19 vaccine       Relevant Orders   Pfizer Fall 2023 Covid-19 Vaccine 172yrand older   Need for influenza vaccination       Relevant Orders   Flu vaccine HIGH DOSE PF (Fluzone High dose)   Screening for lipid disorders       Relevant Orders   Lipid panel      Return in about 1 month (around 11/19/2022).     JoJill AlexandersMD

## 2022-10-22 LAB — LIPID PANEL
Chol/HDL Ratio: 2.5 ratio (ref 0.0–5.0)
Cholesterol, Total: 165 mg/dL (ref 100–199)
HDL: 65 mg/dL (ref >39)
LDL Chol Calc (NIH): 88 mg/dL (ref 0–99)
Triglycerides: 63 mg/dL (ref 0–149)
VLDL Cholesterol Cal: 12 mg/dL (ref 5–40)

## 2022-10-22 LAB — CBC WITH DIFFERENTIAL/PLATELET
Basophils Absolute: 0 10*3/uL (ref 0.0–0.2)
Basos: 0 %
EOS (ABSOLUTE): 0.1 10*3/uL (ref 0.0–0.4)
Eos: 3 %
Hematocrit: 42.4 % (ref 37.5–51.0)
Hemoglobin: 14 g/dL (ref 13.0–17.7)
Immature Grans (Abs): 0 10*3/uL (ref 0.0–0.1)
Immature Granulocytes: 0 %
Lymphocytes Absolute: 1.9 10*3/uL (ref 0.7–3.1)
Lymphs: 37 %
MCH: 28.6 pg (ref 26.6–33.0)
MCHC: 33 g/dL (ref 31.5–35.7)
MCV: 87 fL (ref 79–97)
Monocytes Absolute: 0.4 10*3/uL (ref 0.1–0.9)
Monocytes: 7 %
Neutrophils Absolute: 2.7 10*3/uL (ref 1.4–7.0)
Neutrophils: 53 %
Platelets: 206 10*3/uL (ref 150–450)
RBC: 4.89 x10E6/uL (ref 4.14–5.80)
RDW: 11.8 % (ref 11.6–15.4)
WBC: 5.1 10*3/uL (ref 3.4–10.8)

## 2022-10-22 LAB — COMPREHENSIVE METABOLIC PANEL
ALT: 18 IU/L (ref 0–44)
AST: 16 IU/L (ref 0–40)
Albumin/Globulin Ratio: 2 (ref 1.2–2.2)
Albumin: 4.1 g/dL (ref 3.8–4.8)
Alkaline Phosphatase: 62 IU/L (ref 44–121)
BUN/Creatinine Ratio: 13 (ref 10–24)
BUN: 12 mg/dL (ref 8–27)
Bilirubin Total: 0.5 mg/dL (ref 0.0–1.2)
CO2: 23 mmol/L (ref 20–29)
Calcium: 9.1 mg/dL (ref 8.6–10.2)
Chloride: 104 mmol/L (ref 96–106)
Creatinine, Ser: 0.9 mg/dL (ref 0.76–1.27)
Globulin, Total: 2.1 g/dL (ref 1.5–4.5)
Glucose: 76 mg/dL (ref 70–99)
Potassium: 4.2 mmol/L (ref 3.5–5.2)
Sodium: 140 mmol/L (ref 134–144)
Total Protein: 6.2 g/dL (ref 6.0–8.5)
eGFR: 90 mL/min/{1.73_m2} (ref >59)

## 2022-11-06 DIAGNOSIS — H2513 Age-related nuclear cataract, bilateral: Secondary | ICD-10-CM | POA: Diagnosis not present

## 2022-11-06 DIAGNOSIS — H401131 Primary open-angle glaucoma, bilateral, mild stage: Secondary | ICD-10-CM | POA: Diagnosis not present

## 2022-11-19 ENCOUNTER — Ambulatory Visit: Payer: Medicare Other | Admitting: Family Medicine

## 2022-11-25 ENCOUNTER — Ambulatory Visit (INDEPENDENT_AMBULATORY_CARE_PROVIDER_SITE_OTHER): Payer: Medicare HMO | Admitting: Family Medicine

## 2022-11-25 ENCOUNTER — Encounter: Payer: Self-pay | Admitting: Family Medicine

## 2022-11-25 VITALS — BP 146/76 | HR 51 | Temp 97.9°F | Wt 139.0 lb

## 2022-11-25 DIAGNOSIS — R030 Elevated blood-pressure reading, without diagnosis of hypertension: Secondary | ICD-10-CM | POA: Diagnosis not present

## 2022-11-25 NOTE — Patient Instructions (Signed)
Your blood pressure does not need to be treated

## 2022-11-25 NOTE — Progress Notes (Signed)
   Subjective:    Patient ID: Phillip Ryan, male    DOB: [birth year], 72 y.o.   MRN: [id number]  HPI  He is here for recheck on his blood pressure.  He has no particular concerns or complaints.  Review of Systems     Objective:   Physical Exam  Alert and in no distress.  Blood pressure does show slightly elevated systolic.      Assessment & Plan:  Elevated blood pressure reading Although the systolic is slightly elevated, I do not think it is really necessary at this point.  He is comfortable with that.  Recheck here in a year.

## 2023-03-09 DIAGNOSIS — H2513 Age-related nuclear cataract, bilateral: Secondary | ICD-10-CM | POA: Diagnosis not present

## 2023-03-09 DIAGNOSIS — H401131 Primary open-angle glaucoma, bilateral, mild stage: Secondary | ICD-10-CM | POA: Diagnosis not present

## 2023-07-02 DIAGNOSIS — H524 Presbyopia: Secondary | ICD-10-CM | POA: Diagnosis not present

## 2023-08-31 DIAGNOSIS — H2513 Age-related nuclear cataract, bilateral: Secondary | ICD-10-CM | POA: Diagnosis not present

## 2023-08-31 DIAGNOSIS — H401131 Primary open-angle glaucoma, bilateral, mild stage: Secondary | ICD-10-CM | POA: Diagnosis not present

## 2023-10-19 ENCOUNTER — Ambulatory Visit: Payer: No Typology Code available for payment source

## 2023-10-19 DIAGNOSIS — Z0000 Encounter for general adult medical examination without abnormal findings: Secondary | ICD-10-CM | POA: Diagnosis not present

## 2023-10-19 NOTE — Patient Instructions (Signed)
 Mr. Phillip Ryan , Thank you for taking time to come for your Medicare Wellness Visit. I appreciate your ongoing commitment to your health goals. Please review the following plan we discussed and let me know if I can assist you in the future.   Referrals/Orders/Follow-Ups/Clinician Recommendations: none  This is a list of the screening recommended for you and due dates:  Health Maintenance  Topic Date Due   Flu Shot  04/15/2023   COVID-19 Vaccine (6 - 2024-25 season) 05/16/2023   Medicare Annual Wellness Visit  10/18/2024   Colon Cancer Screening  12/10/2024   DTaP/Tdap/Td vaccine (3 - Td or Tdap) 11/02/2031   Pneumonia Vaccine  Completed   Hepatitis C Screening  Completed   Zoster (Shingles) Vaccine  Completed   HPV Vaccine  Aged Out    Advanced directives: (ACP Link)Information on Advanced Care Planning can be found at [place]  Secretary of [facility] Advance Health Care Directives Advance Health Care Directives ([email or web address]   Next Medicare Annual Wellness Visit scheduled for next year: Yes  insert Preventive Care attachment Insert FALL PREVENTION attachment if needed

## 2023-10-19 NOTE — Progress Notes (Signed)
 Subjective:   Phillip Ryan is a 73 y.o. male who presents for Medicare Annual/Subsequent preventive examination.  Visit Complete: Virtual I connected with  Phillip Ryan on 10/19/23 by a audio enabled telemedicine application and verified that I am speaking with the correct person using two identifiers.  Interactive audio and video telecommunications were attempted between this provider and patient, however failed, due to patient having technical difficulties OR patient did not have access to video capability.  We continued and completed visit with audio only.  Patient Location: Home  Provider Location: Office/Clinic  I discussed the limitations of evaluation and management by telemedicine. The patient expressed understanding and agreed to proceed.  Vital Signs: Because this visit was a virtual/telehealth visit, some criteria may be missing or patient reported. Any vitals not documented were not able to be obtained and vitals that have been documented are patient reported.    Cardiac Risk Factors include: advanced age (>58men, >29 women);male gender     Objective:    Today's Vitals   There is no height or weight on file to calculate BMI.     10/19/2023    8:50 AM 10/13/2022    8:45 AM 10/09/2021    1:54 PM 10/08/2020    1:58 PM 10/21/2018    1:39 PM 04/16/2015    2:38 PM  Advanced Directives  Does Patient Have a Medical Advance Directive? No No No No No Yes  Type of Advance Directive      Healthcare Power of Attorney  Does patient want to make changes to medical advance directive?      No - Patient declined  Copy of Healthcare Power of Attorney in Chart?      No - copy requested  Would patient like information on creating a medical advance directive? No - Patient declined  Yes (ED - Information included in AVS) Yes (MAU/Ambulatory/Procedural Areas - Information given) No - Patient declined No - patient declined information    Current Medications (verified) Outpatient  Encounter Medications as of 10/19/2023  Medication Sig   dorzolamide-timolol (COSOPT) 22.3-6.8 MG/ML ophthalmic solution 1 drop 2 (two) times daily.   tadalafil  (CIALIS ) 20 MG tablet Take 1 tablet (20 mg total) by mouth daily as needed for erectile dysfunction.   No facility-administered encounter medications on file as of 10/19/2023.    Allergies (verified) Patient has no known allergies.   History: Past Medical History:  Diagnosis Date   Glaucoma    uses eye drops   Pleural effusion    Tubular adenoma of colon 05/2008   Past Surgical History:  Procedure Laterality Date   CERVICAL FUSION  2009   COLONOSCOPY  2020   MS-MAC-suprep(good)-hems/5 polyps/TA x 5-recall 3 yr   Family History  Problem Relation Age of Onset   Colon cancer Neg Hx    Colon polyps Neg Hx    Esophageal cancer Neg Hx    Rectal cancer Neg Hx    Stomach cancer Neg Hx    Social History   Socioeconomic History   Marital status: Married    Spouse name: Not on file   Number of children: Not on file   Years of education: Not on file   Highest education level: Not on file  Occupational History   Not on file  Tobacco Use   Smoking status: Former   Smokeless tobacco: Never  Vaping Use   Vaping status: Never Used  Substance and Sexual Activity   Alcohol use: Yes  Alcohol/week: 1.0 - 3.0 standard drink of alcohol    Types: 1 - 3 Standard drinks or equivalent per week   Drug use: No   Sexual activity: Yes  Other Topics Concern   Not on file  Social History Narrative   Not on file   Social Drivers of Health   Financial Resource Strain: Low Risk  (10/19/2023)   Overall Financial Resource Strain (CARDIA)    Difficulty of Paying Living Expenses: Not hard at all  Food Insecurity: No Food Insecurity (10/19/2023)   Hunger Vital Sign    Worried About Running Out of Food in the Last Year: Never true    Ran Out of Food in the Last Year: Never true  Transportation Needs: No Transportation Needs (10/19/2023)    PRAPARE - Administrator, Civil Service (Medical): No    Lack of Transportation (Non-Medical): No  Physical Activity: Insufficiently Active (10/19/2023)   Exercise Vital Sign    Days of Exercise per Week: 4 days    Minutes of Exercise per Session: 30 min  Stress: No Stress Concern Present (10/19/2023)   Phillip Ryan [employer] Occupational Health - Occupational Stress Questionnaire    Feeling of Stress : Not at all  Social Connections: Moderately Isolated (10/19/2023)   Social Connection and Isolation Panel [NHANES]    Frequency of Communication with Friends and Family: More than three times a week    Frequency of Social Gatherings with Friends and Family: Three times a week    Attends Religious Services: Never    Active Member of Clubs or Organizations: No    Attends Engineer, Structural: Never    Marital Status: Married    Tobacco Counseling Counseling given: Not Answered   Clinical Intake:  Pre-visit preparation completed: Yes  Pain : No/denies pain     Nutritional Risks: None Diabetes: No  How often do you need to have someone help you when you read instructions, pamphlets, or other written materials from your doctor or pharmacy?: 1 - Never  Interpreter Needed?: No  Information entered by :: NAllen LPN   Activities of Daily Living    10/19/2023    8:47 AM  In your present state of health, do you have any difficulty performing the following activities:  Hearing? 0  Vision? 0  Difficulty concentrating or making decisions? 0  Walking or climbing stairs? 0  Dressing or bathing? 0  Doing errands, shopping? 0  Preparing Food and eating ? N  Using the Toilet? N  In the past six months, have you accidently leaked urine? N  Do you have problems with loss of bowel control? N  Managing your Medications? N  Managing your Finances? N  Housekeeping or managing your Housekeeping? N    Patient Care Team: Phillip Norleen BROCKS, MD as PCP - General (Family  Medicine)  Indicate any recent Medical Services you may have received from other than [facility] providers in the past year (date may be approximate).     Assessment:   This is a routine wellness examination for [name].  Hearing/Vision screen Hearing Screening - Comments:: Denies hearing issues Vision Screening - Comments:: Regular eye exams, [facility]   Goals Addressed             This Visit's Progress    Patient Stated       10/19/2023, denies goals       Depression Screen    10/19/2023    8:52 AM 10/13/2022    8:47  AM 10/09/2021    1:50 PM 10/08/2020    1:58 PM 06/25/2020   10:28 AM 09/16/2018    3:24 PM 10/06/2016    2:12 PM  PHQ 2/9 Scores  PHQ - 2 Score 0 0 0 0 0 0 0  PHQ- 9 Score 0 0         Fall Risk    10/19/2023    8:51 AM 10/13/2022    9:03 AM 10/09/2021    1:49 PM 10/08/2020    1:58 PM 06/25/2020   10:28 AM  Fall Risk   Falls in the past year? 0 0 0 0 0  Number falls in past yr: 0 0 1    Injury with Fall? 0 0 0    Risk for fall due to : Medication side effect Medication side effect No Fall Risks    Follow up Falls prevention discussed;Falls evaluation completed Falls prevention discussed;Education provided;Falls evaluation completed Falls evaluation completed      MEDICARE RISK AT HOME: Medicare Risk at Home Any stairs in or around the home?: No If so, are there any without handrails?: No Home free of loose throw rugs in walkways, pet beds, electrical cords, etc?: Yes Adequate lighting in your home to reduce risk of falls?: Yes Life alert?: No Use of a cane, walker or w/c?: No Grab bars in the bathroom?: No Shower chair or bench in shower?: No Elevated toilet seat or a handicapped toilet?: No  TIMED UP AND GO:  Was the test performed?  No    Cognitive Function:        10/19/2023    8:52 AM 10/13/2022    8:49 AM  6CIT Screen  What Year? 0 points 0 points  What month? 0 points 0 points  What time? 0 points 0 points  Count back from 20 0 points 0  points  Months in reverse 2 points 0 points  Repeat phrase 0 points 0 points  Total Score 2 points 0 points    Immunizations Immunization History  Administered Date(s) Administered   Fluad Quad(high Dose 65+) 06/25/2020, 10/09/2021, 10/21/2022   PFIZER(Purple Top)SARS-COV-2 Vaccination 11/10/2019, 12/01/2019, 06/25/2020   Pfizer Covid-19 Vaccine Bivalent Booster 48yrs & up 10/09/2021   Pfizer(Comirnaty)Fall Seasonal Vaccine 12 years and older 10/21/2022   Pneumococcal Conjugate-13 11/21/2014   Pneumococcal Polysaccharide-23 10/06/2016   Tdap 08/09/2011, 11/01/2021   Zoster Recombinant(Shingrix) 11/01/2021, 01/10/2022    TDAP status: Up to date  Flu Vaccine status: Due, Education has been provided regarding the importance of this vaccine. Advised may receive this vaccine at local pharmacy or Health Dept. Aware to provide a copy of the vaccination record if obtained from local pharmacy or Health Dept. Verbalized acceptance and understanding.  Pneumococcal vaccine status: Up to date  Covid-19 vaccine status: Completed vaccines  Qualifies for Shingles Vaccine? Yes   Zostavax completed Yes   Shingrix Completed?: Yes  Screening Tests Health Maintenance  Topic Date Due   INFLUENZA VACCINE  04/15/2023   COVID-19 Vaccine (6 - 2024-25 season) 05/16/2023   Medicare Annual Wellness (AWV)  10/18/2024   Colonoscopy  12/10/2024   DTaP/Tdap/Td (3 - Td or Tdap) 11/02/2031   Pneumonia Vaccine 25+ Years old  Completed   Hepatitis C Screening  Completed   Zoster Vaccines- Shingrix  Completed   HPV VACCINES  Aged Out    Health Maintenance  Health Maintenance Due  Topic Date Due   INFLUENZA VACCINE  04/15/2023   COVID-19 Vaccine (6 - 2024-25 season) 05/16/2023  Colorectal cancer screening: Type of screening: Colonoscopy. Completed 12/10/2021. Repeat every 3 years  Lung Cancer Screening: (Low Dose CT Chest recommended if Age 62-80 years, 20 pack-year currently smoking OR have quit  w/in 15years.) does not qualify.   Lung Cancer Screening Referral: no  Additional Screening:  Hepatitis C Screening: does qualify; Completed 09/16/2018  Vision Screening: Recommended annual ophthalmology exams for early detection of glaucoma and other disorders of the eye. Is the patient up to date with their annual eye exam?  Yes  Who is the provider or what is the name of the office in which the patient attends annual eye exams? [facility] If pt is not established with a provider, would they like to be referred to a provider to establish care? No .   Dental Screening: Recommended annual dental exams for proper oral hygiene  Diabetic Foot Exam: n/a  Community Resource Referral / Chronic Care Management: CRR required this visit?  No   CCM required this visit?  No     Plan:     I have personally reviewed and noted the following in the patient's chart:   Medical and social history Use of alcohol, tobacco or illicit drugs  Current medications and supplements including opioid prescriptions. Patient is not currently taking opioid prescriptions. Functional ability and status Nutritional status Physical activity Advanced directives List of other physicians Hospitalizations, surgeries, and ER visits in previous 12 months Vitals Screenings to include cognitive, depression, and falls Referrals and appointments  In addition, I have reviewed and discussed with patient certain preventive protocols, quality metrics, and best practice recommendations. A written personalized care plan for preventive services as well as general preventive health recommendations were provided to patient.     Ardella FORBES Dawn, LPN   03/18/7973   After Visit Summary: (Pick Up) Due to this being a telephonic visit, with patients personalized plan was offered to patient and patient has requested to Pick up at office.  Nurse Notes: none

## 2023-11-08 ENCOUNTER — Telehealth: Payer: Self-pay | Admitting: Family Medicine

## 2023-11-08 ENCOUNTER — Other Ambulatory Visit: Payer: Self-pay | Admitting: Family Medicine

## 2023-11-08 DIAGNOSIS — N529 Male erectile dysfunction, unspecified: Secondary | ICD-10-CM

## 2023-11-08 NOTE — Telephone Encounter (Signed)
   Copied from CRM [id number]. Topic: Clinical - Medication Refill >> Nov 08, 2023  3:15 PM Everette C wrote: Most Recent Primary Care Visit:  Provider: Barb Merino  Department: [facility]  Visit Type: MEDICARE AWV, SEQUENTIAL  Date: 10/19/2023  Medication: tadalafil (CIALIS) 20 MG tablet [[id number]]  Has the patient contacted their pharmacy? Yes (Agent: If no, request that the patient contact the pharmacy for the refill. If patient does not wish to contact the pharmacy document the reason why and proceed with request.) (Agent: If yes, when and what did the pharmacy advise?)  Is this the correct pharmacy for this prescription? Yes If no, delete pharmacy and type the correct one.  This is the patient's preferred pharmacy:  [id number] - [place] ([place] - [address] [place] [address] Phone: [phone] Fax: [phone]   Has the prescription been filled recently? Yes  Is the patient out of the medication? Yes  Has the patient been seen for an appointment in the last year OR does the patient have an upcoming appointment? Yes  Can we respond through MyChart? No  Agent: Please be advised that Rx refills may take up to 3 business days. We ask that you follow-up with your pharmacy.

## 2023-11-08 NOTE — Telephone Encounter (Signed)
 Refill request already sent to provider

## 2023-11-23 ENCOUNTER — Ambulatory Visit: Payer: No Typology Code available for payment source | Admitting: Family Medicine

## 2023-11-23 ENCOUNTER — Encounter: Payer: Self-pay | Admitting: Family Medicine

## 2023-11-23 VITALS — BP 170/80 | HR 52 | Ht 67.0 in | Wt 139.8 lb

## 2023-11-23 DIAGNOSIS — Z9889 Other specified postprocedural states: Secondary | ICD-10-CM | POA: Diagnosis not present

## 2023-11-23 DIAGNOSIS — Z0000 Encounter for general adult medical examination without abnormal findings: Secondary | ICD-10-CM | POA: Diagnosis not present

## 2023-11-23 DIAGNOSIS — Z23 Encounter for immunization: Secondary | ICD-10-CM

## 2023-11-23 DIAGNOSIS — Z860100 Personal history of colon polyps, unspecified: Secondary | ICD-10-CM | POA: Diagnosis not present

## 2023-11-23 DIAGNOSIS — Z13220 Encounter for screening for lipoid disorders: Secondary | ICD-10-CM

## 2023-11-23 DIAGNOSIS — Z136 Encounter for screening for cardiovascular disorders: Secondary | ICD-10-CM | POA: Diagnosis not present

## 2023-11-23 NOTE — Progress Notes (Signed)
 Complete physical exam  Patient: Phillip Ryan   DOB: 15-Mar-1951   73 y.o. Male  MRN: [id number]  Subjective:    Chief Complaint  Patient presents with   Annual Exam    Fasting annual exam. Had AWV 2/25.     Phillip Ryan is a 73 y.o. male who presents today for a complete physical exam.  He reports consuming a general diet.  Does not exercise.  He generally feels well. He reports sleeping well.  He continues on Cialis for his underlying ED and is doing well on it.  He is not having any neck discomfort.  He does have a history of colonic polyps and is scheduled for repeat colonoscopy next year.  Otherwise he has no particular concerns or complaints.  He does not smoke and usually drinks approximately 2 beverages per night.  Most recent fall risk assessment:    11/23/2023   11:19 AM  Fall Risk   Falls in the past year? 0  Number falls in past yr: 0  Injury with Fall? 0  Risk for fall due to : No Fall Risks  Follow up Falls evaluation completed     Most recent depression screenings:    11/23/2023   11:19 AM 10/19/2023    8:52 AM  PHQ 2/9 Scores  PHQ - 2 Score 0 0  PHQ- 9 Score  0    Vision:Within last year    Immunization History  Administered Date(s) Administered   Fluad Quad(high Dose 65+) 06/25/2020, 10/09/2021, 10/21/2022   PFIZER(Purple Top)SARS-COV-2 Vaccination 11/10/2019, 12/01/2019, 06/25/2020   PNEUMOCOCCAL CONJUGATE-20 11/23/2023   Pfizer Covid-19 Vaccine Bivalent Booster 56yrs & up 10/09/2021   Pfizer(Comirnaty)Fall Seasonal Vaccine 12 years and older 10/21/2022, 11/23/2023   Pneumococcal Conjugate-13 11/21/2014   Pneumococcal Polysaccharide-23 10/06/2016   Respiratory Syncytial Virus Vaccine,Recomb Aduvanted(Arexvy) 10/22/2022   Tdap 08/09/2011, 11/01/2021   Zoster Recombinant(Shingrix) 11/01/2021, 01/10/2022    Health Maintenance  Topic Date Due   INFLUENZA VACCINE  04/15/2023   Medicare Annual Wellness (AWV)  10/18/2024   Colonoscopy   12/10/2024   DTaP/Tdap/Td (3 - Td or Tdap) 11/02/2031   Pneumonia Vaccine 65+ Years old  Completed   COVID-19 Vaccine  Completed   Hepatitis C Screening  Completed   Zoster Vaccines- Shingrix  Completed   HPV VACCINES  Aged Out    Patient Care Team: Ronnald Nian, MD as PCP - General (Family Medicine)  Optho:[facility] [place] Dentist: Robbie Lis [facility] GI:Dr. Russella Dar  Outpatient Medications Prior to Visit  Medication Sig Note   dorzolamide-timolol (COSOPT) 22.3-6.8 MG/ML ophthalmic solution 1 drop 2 (two) times daily.    tadalafil (CIALIS) 20 MG tablet TAKE 1 TABLET BY MOUTH ONCE DAILY AS NEEDED FOR ERECTILE DYSFUNCTION (Patient not taking: Reported on 11/23/2023) 11/23/2023: As needed   No facility-administered medications prior to visit.    Review of Systems  All other systems reviewed and are negative.   Family and social history as well as health maintenance and immunizations was reviewed.     Objective:    BP (!) 170/80   Pulse (!) 52   Ht 5\' 7"  (1.702 m)   Wt 139 lb 12.8 oz (63.4 kg)   BMI 21.90 kg/m    Physical Exam  Alert and in no distress. Tympanic membranes and canals are normal. Pharyngeal area is normal. Neck is supple without adenopathy or thyromegaly. Cardiac exam shows a regular sinus rhythm without murmurs or gallops. Lungs are clear to auscultation.  Assessment & Plan:    Routine general medical examination at a health care facility  Need for pneumococcal 20-valent conjugate vaccination - Plan: Pneumococcal conjugate vaccine 20-valent (Prevnar 20)  Need for COVID-19 vaccine - Plan: Pfizer Comirnaty Covid -19 Vaccine 13yrs and older  History of colonic polyps - Plan: Comprehensive metabolic panel, CBC with Differential/Platelet  Hx of cervical discectomy  Screening for lipid disorders - Plan: Lipid panel  He will call when he needs a refill on his Cialis Return in about 1 year (around 11/22/2024).      Sharlot Gowda, MD

## 2023-11-24 LAB — CBC WITH DIFFERENTIAL/PLATELET
Basophils Absolute: 0 10*3/uL (ref 0.0–0.2)
Basos: 0 %
EOS (ABSOLUTE): 0.1 10*3/uL (ref 0.0–0.4)
Eos: 3 %
Hematocrit: 41.8 % (ref 37.5–51.0)
Hemoglobin: 13.6 g/dL (ref 13.0–17.7)
Immature Grans (Abs): 0 10*3/uL (ref 0.0–0.1)
Immature Granulocytes: 0 %
Lymphocytes Absolute: 1.5 10*3/uL (ref 0.7–3.1)
Lymphs: 34 %
MCH: 28.5 pg (ref 26.6–33.0)
MCHC: 32.5 g/dL (ref 31.5–35.7)
MCV: 87 fL (ref 79–97)
Monocytes Absolute: 0.4 10*3/uL (ref 0.1–0.9)
Monocytes: 9 %
Neutrophils Absolute: 2.3 10*3/uL (ref 1.4–7.0)
Neutrophils: 54 %
Platelets: 213 10*3/uL (ref 150–450)
RBC: 4.78 x10E6/uL (ref 4.14–5.80)
RDW: 12 % (ref 11.6–15.4)
WBC: 4.4 10*3/uL (ref 3.4–10.8)

## 2023-11-24 LAB — LIPID PANEL
Chol/HDL Ratio: 2.2 ratio (ref 0.0–5.0)
Cholesterol, Total: 164 mg/dL (ref 100–199)
HDL: 74 mg/dL (ref >39)
LDL Chol Calc (NIH): 79 mg/dL (ref 0–99)
Triglycerides: 55 mg/dL (ref 0–149)
VLDL Cholesterol Cal: 11 mg/dL (ref 5–40)

## 2023-11-24 LAB — COMPREHENSIVE METABOLIC PANEL
ALT: 18 IU/L (ref 0–44)
AST: 21 IU/L (ref 0–40)
Albumin: 3.9 g/dL (ref 3.8–4.8)
Alkaline Phosphatase: 69 IU/L (ref 44–121)
BUN/Creatinine Ratio: 10 (ref 10–24)
BUN: 9 mg/dL (ref 8–27)
Bilirubin Total: 0.5 mg/dL (ref 0.0–1.2)
CO2: 24 mmol/L (ref 20–29)
Calcium: 9.2 mg/dL (ref 8.6–10.2)
Chloride: 104 mmol/L (ref 96–106)
Creatinine, Ser: 0.86 mg/dL (ref 0.76–1.27)
Globulin, Total: 2 g/dL (ref 1.5–4.5)
Glucose: 71 mg/dL (ref 70–99)
Potassium: 4.5 mmol/L (ref 3.5–5.2)
Sodium: 142 mmol/L (ref 134–144)
Total Protein: 5.9 g/dL — ABNORMAL LOW (ref 6.0–8.5)
eGFR: 91 mL/min/{1.73_m2} (ref >59)

## 2024-01-20 ENCOUNTER — Other Ambulatory Visit: Payer: Self-pay | Admitting: Nurse Practitioner

## 2024-01-20 DIAGNOSIS — N529 Male erectile dysfunction, unspecified: Secondary | ICD-10-CM

## 2024-01-20 NOTE — Telephone Encounter (Signed)
 Is this okay to refill?

## 2024-04-14 ENCOUNTER — Other Ambulatory Visit: Payer: Self-pay | Admitting: Family Medicine

## 2024-04-14 DIAGNOSIS — N529 Male erectile dysfunction, unspecified: Secondary | ICD-10-CM

## 2024-04-14 MED ORDER — TADALAFIL 20 MG PO TABS: 20.0000 mg | ORAL_TABLET | Freq: Every day | ORAL | 0 refills | Status: DC | PRN

## 2024-04-14 NOTE — Telephone Encounter (Signed)
 Copied from CRM [id number]. Topic: Clinical - Medication Refill >> Apr 14, 2024  1:41 PM Carlatta H wrote: Medication: tadalafil  (CIALIS ) 20 MG tablet  Has the patient contacted their pharmacy? No (Agent: If no, request that the patient contact the pharmacy for the refill. If patient does not wish to contact the pharmacy document the reason why and proceed with request.) (Agent: If yes, when and what did the pharmacy advise?)  This is the patient's preferred pharmacy:  [id number] - [place] - [address] [place] ([place]) [place] [address] Phone: [phone] Fax: [phone]  Is this the correct pharmacy for this prescription? Yes If no, delete pharmacy and type the correct one.   Has the prescription been filled recently? No  Is the patient out of the medication? Yes  Has the patient been seen for an appointment in the last year OR does the patient have an upcoming appointment? Yes  Can we respond through MyChart? No  Agent: Please be advised that Rx refills may take up to 3 business days. We ask that you follow-up with your pharmacy.

## 2024-07-11 ENCOUNTER — Other Ambulatory Visit: Payer: Self-pay | Admitting: Medical

## 2024-07-11 DIAGNOSIS — N529 Male erectile dysfunction, unspecified: Secondary | ICD-10-CM

## 2024-09-28 ENCOUNTER — Other Ambulatory Visit: Payer: Self-pay | Admitting: Family Medicine

## 2024-09-28 DIAGNOSIS — N529 Male erectile dysfunction, unspecified: Secondary | ICD-10-CM

## 2024-09-28 NOTE — Telephone Encounter (Signed)
 Copied from CRM [id number]. Topic: Clinical - Medication Refill >> Sep 28, 2024  1:05 PM Gustabo D wrote: Medication: tadalafil  (CIALIS ) 20 MG tablet  Has the patient contacted their pharmacy? No (Agent: If no, request that the patient contact the pharmacy for the refill. If patient does not wish to contact the pharmacy document the reason why and proceed with request.) (Agent: If yes, when and what did the pharmacy advise?)  This is the patient's preferred pharmacy:  [id number] - [place] - [address] [place] ([place]) [place] [address] Phone: [phone] Fax: [phone]  Is this the correct pharmacy for this prescription? Yes If no, delete pharmacy and type the correct one.   Has the prescription been filled recently? No  Is the patient out of the medication? Yes  Has the patient been seen for an appointment in the last year OR does the patient have an upcoming appointment? Yes  Can we respond through MyChart? No  Agent: Please be advised that Rx refills may take up to 3 business days. We ask that you follow-up with your pharmacy.

## 2024-09-28 NOTE — Telephone Encounter (Signed)
 Called Patient to schedule physical since he is overdue for visit. Left voicemail to call back to schedule.

## 2024-09-30 MED ORDER — TADALAFIL 20 MG PO TABS: 20.0000 mg | ORAL_TABLET | Freq: Every day | ORAL | 0 refills | Status: AC | PRN

## 2024-10-13 ENCOUNTER — Encounter: Payer: Self-pay | Admitting: Gastroenterology

## 2024-10-24 ENCOUNTER — Ambulatory Visit: Payer: Self-pay

## 2024-12-13 ENCOUNTER — Encounter: Payer: Self-pay | Admitting: Family Medicine
